# Patient Record
Sex: Female | Born: 1985
Health system: Southern US, Community
[De-identification: ages and names within clinical notes are randomized; demographics above are authoritative.]

## PROBLEM LIST (undated history)

## (undated) ENCOUNTER — Inpatient Hospital Stay (HOSPITAL_COMMUNITY): Payer: Self-pay

## (undated) DIAGNOSIS — K219 Gastro-esophageal reflux disease without esophagitis: Secondary | ICD-10-CM

## (undated) DIAGNOSIS — R351 Nocturia: Secondary | ICD-10-CM

## (undated) DIAGNOSIS — J45909 Unspecified asthma, uncomplicated: Secondary | ICD-10-CM

## (undated) DIAGNOSIS — R350 Frequency of micturition: Secondary | ICD-10-CM

## (undated) DIAGNOSIS — N3010 Interstitial cystitis (chronic) without hematuria: Secondary | ICD-10-CM

## (undated) DIAGNOSIS — R102 Pelvic and perineal pain: Secondary | ICD-10-CM

## (undated) DIAGNOSIS — R3915 Urgency of urination: Secondary | ICD-10-CM

## (undated) HISTORY — PX: WISDOM TOOTH EXTRACTION: SHX21

---
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
## 2006-07-13 ENCOUNTER — Emergency Department (HOSPITAL_COMMUNITY): Admission: EM | Admit: 2006-07-13 | Discharge: 2006-07-13 | Payer: Self-pay | Admitting: Emergency Medicine

## 2009-08-22 HISTORY — PX: OTHER SURGICAL HISTORY: SHX169

## 2012-07-06 ENCOUNTER — Other Ambulatory Visit: Payer: Self-pay | Admitting: Urology

## 2012-07-27 ENCOUNTER — Encounter (HOSPITAL_BASED_OUTPATIENT_CLINIC_OR_DEPARTMENT_OTHER): Payer: Self-pay | Admitting: *Deleted

## 2012-07-27 NOTE — Progress Notes (Signed)
NPO AFTER MN. ARRIVES AT 0745. NEEDS HG AND URINE PREG. WILL DO ADVAIR INHALER AM OF SURG W/ SIP OF WATER.

## 2012-08-01 NOTE — H&P (Signed)
History of Present Illness   Sherry Fuentes has suprapubic pressure and frequency. She has had microscopic hematuria. She has uncommon urge incontinence not wearing a pad. She has frequency. We talked about a hydrodistension. Dawn was going to call her about co-pays.   Review of Systems: No change in bowel or neurologic systems.   Urinalysis: 3-6 red blood cells.  She was here to review her CT scan.  I reviewed Sherry Fuentes's CT scan and I thought that it was within normal limits. I thought she had some peripelvic cysts. I will call if the report differs.    Past Medical History Problems  1. History of  Asthma 493.90  Surgical History Problems  1. History of  Oral Surgery Tooth Extraction 2. History of  Wrist Surgery  Current Meds 1. Advair Diskus 100-50 MCG/DOSE Inhalation Aerosol Powder Breath Activated; Therapy:  (Recorded:01Nov2013) to 2. Allegra Allergy 180 MG Oral Tablet; Therapy: (Recorded:01Nov2013) to 3. Ibuprofen 800 MG Oral Tablet; Therapy: (Recorded:01Nov2013) to 4. Multi Vitamin/Minerals Oral Tablet; Therapy: (Recorded:01Nov2013) to  Allergies Medication  1. No Known Drug Allergies  Family History Problems  1. Maternal aunt's history of  Breast Cancer V16.3 2. Maternal history of  Diabetes Mellitus V18.0 3. Maternal grandmother's history of  Diabetes Mellitus V18.0 4. Maternal grandmother's history of  Heart Disease V17.49 5. Fraternal history of  Nephrolithiasis 6. Maternal history of  Skin Cancer V16.8 7. Maternal grandfather's history of  Skin Cancer V16.8  Social History Problems  1. Alcohol Use 2. Caffeine Use 3. Marital History - Currently Married 4. Never A Smoker 5. Occupation: Homemaker  Results/Data  Urine [Data Includes: Last 1 Day]   14Nov2013  COLOR YELLOW   APPEARANCE CLEAR   SPECIFIC GRAVITY 1.015   pH 6.0   GLUCOSE NEG mg/dL  BILIRUBIN NEG   KETONE NEG mg/dL  BLOOD MOD   PROTEIN NEG mg/dL  UROBILINOGEN 0.2 mg/dL  NITRITE NEG    LEUKOCYTE ESTERASE NEG   SQUAMOUS EPITHELIAL/HPF FEW   WBC NONE SEEN WBC/hpf  RBC 3-6 RBC/hpf  BACTERIA RARE   CRYSTALS NONE SEEN   CASTS NONE SEEN    Assessment Assessed  1. Microscopic Hematuria 599.72 2. Abdominal Pain 789.00  Plan   Discussion/Summary   Sherry Fuentes will be called if her CT scan ____. I talked to her a little bit about what would happen if she had interstitial cystitis and she understands it is not an easy condition to treat. She would like to proceed with the hydrodistension and I will have Dawn call her. Dawn may have spoken to her last time but specifics on co-pays I do not think were given.  After a thorough review of the management options for the patient's condition the patient  elected to proceed with surgical therapy as noted above. We have discussed the potential benefits and risks of the procedure, side effects of the proposed treatment, the likelihood of the patient achieving the goals of the procedure, and any potential problems that might occur during the procedure or recuperation. Informed consent has been obtained.

## 2012-08-02 ENCOUNTER — Encounter (HOSPITAL_BASED_OUTPATIENT_CLINIC_OR_DEPARTMENT_OTHER): Payer: Self-pay | Admitting: Anesthesiology

## 2012-08-02 ENCOUNTER — Ambulatory Visit (HOSPITAL_BASED_OUTPATIENT_CLINIC_OR_DEPARTMENT_OTHER)
Admission: RE | Admit: 2012-08-02 | Discharge: 2012-08-02 | Disposition: A | Discharge status: Home / Self Care | Payer: BC Managed Care – PPO | Source: Ambulatory Visit | Attending: Urology | Admitting: Urology

## 2012-08-02 ENCOUNTER — Encounter (HOSPITAL_BASED_OUTPATIENT_CLINIC_OR_DEPARTMENT_OTHER): Payer: Self-pay | Admitting: *Deleted

## 2012-08-02 ENCOUNTER — Ambulatory Visit (HOSPITAL_BASED_OUTPATIENT_CLINIC_OR_DEPARTMENT_OTHER): Payer: BC Managed Care – PPO | Admitting: Anesthesiology

## 2012-08-02 ENCOUNTER — Encounter (HOSPITAL_BASED_OUTPATIENT_CLINIC_OR_DEPARTMENT_OTHER)
Admission: RE | Disposition: A | Discharge status: Home / Self Care | Payer: Self-pay | Source: Ambulatory Visit | Attending: Urology

## 2012-08-02 DIAGNOSIS — N3010 Interstitial cystitis (chronic) without hematuria: Secondary | ICD-10-CM | POA: Insufficient documentation

## 2012-08-02 DIAGNOSIS — Z79899 Other long term (current) drug therapy: Secondary | ICD-10-CM | POA: Insufficient documentation

## 2012-08-02 DIAGNOSIS — R3129 Other microscopic hematuria: Secondary | ICD-10-CM | POA: Insufficient documentation

## 2012-08-02 DIAGNOSIS — J45909 Unspecified asthma, uncomplicated: Secondary | ICD-10-CM | POA: Insufficient documentation

## 2012-08-02 HISTORY — DX: Nocturia: R35.1

## 2012-08-02 HISTORY — DX: Urgency of urination: R39.15

## 2012-08-02 HISTORY — PX: CYSTO WITH HYDRODISTENSION: SHX5453

## 2012-08-02 HISTORY — DX: Pelvic and perineal pain: R10.2

## 2012-08-02 HISTORY — DX: Frequency of micturition: R35.0

## 2012-08-02 HISTORY — DX: Unspecified asthma, uncomplicated: J45.909

## 2012-08-02 LAB — POCT HEMOGLOBIN-HEMACUE: Hemoglobin: 12.4 g/dL (ref 12.0–15.0)

## 2012-08-02 LAB — POCT PREGNANCY, URINE: Preg Test, Ur: NEGATIVE

## 2012-08-02 SURGERY — CYSTOSCOPY, WITH BLADDER HYDRODISTENSION
Anesthesia: General | Site: Bladder | Wound class: Clean Contaminated

## 2012-08-02 MED ORDER — FENTANYL CITRATE 0.05 MG/ML IJ SOLN
INTRAMUSCULAR | Status: DC | PRN
  Administered 2012-08-02 (×4): 50 ug via INTRAVENOUS

## 2012-08-02 MED ORDER — HYDROCODONE-ACETAMINOPHEN 5-500 MG PO TABS: 1.0000 | ORAL_TABLET | Freq: Four times a day (QID) | ORAL | Status: DC | PRN

## 2012-08-02 MED ORDER — CIPROFLOXACIN IN D5W 400 MG/200ML IV SOLN
400.0000 mg | INTRAVENOUS | Status: AC
  Administered 2012-08-02: 400 mg via INTRAVENOUS
  Filled 2012-08-02: qty 200

## 2012-08-02 MED ORDER — STERILE WATER FOR IRRIGATION IR SOLN
Status: DC | PRN
  Administered 2012-08-02: 3000 mL

## 2012-08-02 MED ORDER — PHENAZOPYRIDINE HCL 200 MG PO TABS
ORAL | Status: DC | PRN
  Administered 2012-08-02: 09:00:00 via INTRAVESICAL

## 2012-08-02 MED ORDER — PROPOFOL 10 MG/ML IV BOLUS
INTRAVENOUS | Status: DC | PRN
  Administered 2012-08-02: 240 mg via INTRAVENOUS

## 2012-08-02 MED ORDER — LACTATED RINGERS IV SOLN
INTRAVENOUS | Status: DC
  Filled 2012-08-02: qty 1000

## 2012-08-02 MED ORDER — DEXAMETHASONE SODIUM PHOSPHATE 4 MG/ML IJ SOLN
INTRAMUSCULAR | Status: DC | PRN
  Administered 2012-08-02: 10 mg via INTRAVENOUS

## 2012-08-02 MED ORDER — HYDROCODONE-ACETAMINOPHEN 5-325 MG PO TABS
1.0000 | ORAL_TABLET | Freq: Four times a day (QID) | ORAL | Status: DC | PRN
  Administered 2012-08-02: 1 via ORAL
  Filled 2012-08-02: qty 2

## 2012-08-02 MED ORDER — PROMETHAZINE HCL 25 MG/ML IJ SOLN
6.2500 mg | INTRAMUSCULAR | Status: DC | PRN
  Filled 2012-08-02: qty 1

## 2012-08-02 MED ORDER — CIPROFLOXACIN HCL 250 MG PO TABS: 250.0000 mg | ORAL_TABLET | Freq: Two times a day (BID) | ORAL | Status: DC

## 2012-08-02 MED ORDER — MEPERIDINE HCL 25 MG/ML IJ SOLN
6.2500 mg | INTRAMUSCULAR | Status: DC | PRN
  Filled 2012-08-02: qty 1

## 2012-08-02 MED ORDER — MIDAZOLAM HCL 5 MG/5ML IJ SOLN
INTRAMUSCULAR | Status: DC | PRN
  Administered 2012-08-02 (×2): 1 mg via INTRAVENOUS

## 2012-08-02 MED ORDER — KETOROLAC TROMETHAMINE 30 MG/ML IJ SOLN
INTRAMUSCULAR | Status: DC | PRN
  Administered 2012-08-02: 30 mg via INTRAVENOUS

## 2012-08-02 MED ORDER — LIDOCAINE HCL (CARDIAC) 20 MG/ML IV SOLN
INTRAVENOUS | Status: DC | PRN
  Administered 2012-08-02: 60 mg via INTRAVENOUS

## 2012-08-02 MED ORDER — FENTANYL CITRATE 0.05 MG/ML IJ SOLN
25.0000 ug | INTRAMUSCULAR | Status: DC | PRN
  Filled 2012-08-02: qty 1

## 2012-08-02 MED ORDER — LACTATED RINGERS IV SOLN
INTRAVENOUS | Status: DC
  Administered 2012-08-02: 100 mL/h via INTRAVENOUS
  Administered 2012-08-02 (×2): via INTRAVENOUS
  Filled 2012-08-02: qty 1000

## 2012-08-02 SURGICAL SUPPLY — 21 items
BAG DRAIN URO-CYSTO SKYTR STRL (DRAIN) ×2 IMPLANT
BAG DRN UROCATH (DRAIN) ×1
CANISTER SUCT LVC 12 LTR MEDI- (MISCELLANEOUS) IMPLANT
CATH FOLEY 2WAY SLVR  5CC 18FR (CATHETERS)
CATH FOLEY 2WAY SLVR 5CC 18FR (CATHETERS) IMPLANT
CATH ROBINSON RED A/P 12FR (CATHETERS) IMPLANT
CATH ROBINSON RED A/P 14FR (CATHETERS) ×1 IMPLANT
CLOTH BEACON ORANGE TIMEOUT ST (SAFETY) ×2 IMPLANT
DRAPE CAMERA CLOSED 9X96 (DRAPES) ×2 IMPLANT
ELECT REM PT RETURN 9FT ADLT (ELECTROSURGICAL)
ELECTRODE REM PT RTRN 9FT ADLT (ELECTROSURGICAL) IMPLANT
GLOVE BIO SURGEON STRL SZ7.5 (GLOVE) ×2 IMPLANT
GLOVE BIOGEL PI IND STRL 7.0 (GLOVE) IMPLANT
GLOVE BIOGEL PI INDICATOR 7.0 (GLOVE) ×1
GOWN STRL REIN XL XLG (GOWN DISPOSABLE) ×3 IMPLANT
NDL SAFETY ECLIPSE 18X1.5 (NEEDLE) ×1 IMPLANT
NEEDLE HYPO 18GX1.5 SHARP (NEEDLE) ×2
PACK CYSTOSCOPY (CUSTOM PROCEDURE TRAY) ×2 IMPLANT
SUT SILK 0 TIES 10X30 (SUTURE) IMPLANT
SYR 20CC LL (SYRINGE) ×2 IMPLANT
WATER STERILE IRR 3000ML UROMA (IV SOLUTION) ×2 IMPLANT

## 2012-08-02 NOTE — Op Note (Signed)
Preoperative diagnosis: Pelvic pain Postoperative diagnosis: Interstitial cystitis Surgery bladder hydrodistention plus cystoscopy and bladder installation therapy Surgeon: Dr. Lorin Picket Telia Amundson  The patient consented the above procedure with the above diagnoses. Preoperative antibiotics were given. 21 French cystoscope was utilized. Leg position was excellent  Bladder mucosa and trigone were normal. Is no stitch or foreign body or carcinoma. Bladder was hydrodistended to approxi-750 mL  On reinspection she diffuse glomerulations in the floor the bladder. There is no bladder injury. The glomerulations were fine  The bladder was emptied. As a separate procedure with a red rubber catheter I instilled 15 cc of 0.5% Marcaine and 400 mg of pretty and  The patient be treated for interstitial cystitis

## 2012-08-02 NOTE — Interval H&P Note (Signed)
**Note Sherry-Identified via Obfuscation** History and Physical Interval Note:  08/02/2012 7:07 AM  Sherry Fuentes  has presented today for surgery, with the diagnosis of pelvic pain  The various methods of treatment have been discussed with the patient and family. After consideration of risks, benefits and other options for treatment, the patient has consented to  Procedure(s) (LRB) with comments: CYSTOSCOPY/HYDRODISTENSION (N/A) - Cystoscopy/Hydrodistension Instillation of Marcaine and Pyridum as a surgical intervention .  The patient's history has been reviewed, patient examined, no change in status, stable for surgery.  I have reviewed the patient's chart and labs.  Questions were answered to the patient's satisfaction.     Sherry Fuentes A

## 2012-08-02 NOTE — Anesthesia Postprocedure Evaluation (Signed)
  Anesthesia Post-op Note  Patient: [name]  Procedure(s) Performed: Procedure(s) (LRB): CYSTOSCOPY/HYDRODISTENSION (N/A)  Patient Location: PACU  Anesthesia Type: General  Level of Consciousness: awake and alert   Airway and Oxygen Therapy: Patient Spontanous Breathing  Post-op Pain: mild  Post-op Assessment: Post-op Vital signs reviewed, Patient's Cardiovascular Status Stable, Respiratory Function Stable, Patent Airway and No signs of Nausea or vomiting  Last Vitals:  Filed Vitals:   08/02/12 1015  BP: 108/69  Pulse: 96  Temp:   Resp: 16    Post-op Vital Signs: stable   Complications: No apparent anesthesia complications

## 2012-08-02 NOTE — Anesthesia Procedure Notes (Signed)
Procedure Name: LMA Insertion Date/Time: 08/02/2012 9:17 AM Performed by: Fran Lowes Pre-anesthesia Checklist: Patient identified, Emergency Drugs available, Suction available and Patient being monitored Patient Re-evaluated:Patient Re-evaluated prior to inductionOxygen Delivery Method: Circle System Utilized Preoxygenation: Pre-oxygenation with 100% oxygen Intubation Type: IV induction Ventilation: Mask ventilation without difficulty LMA: LMA inserted LMA Size: 4.0 Number of attempts: 1 Airway Equipment and Method: bite block Placement Confirmation: positive ETCO2 Tube secured with: Tape Dental Injury: Teeth and Oropharynx as per pre-operative assessment

## 2012-08-02 NOTE — Transfer of Care (Signed)
Immediate Anesthesia Transfer of Care Note  Patient: Sherry Fuentes  Procedure(s) Performed: Procedure(s) (LRB): CYSTOSCOPY/HYDRODISTENSION (N/A)  Patient Location: Patient transported to PACU with oxygen via face mask at 4 Liters / Min  Anesthesia Type: General  Level of Consciousness: awake and alert   Airway & Oxygen Therapy: Patient Spontanous Breathing and Patient connected to face mask oxygen  Post-op Assessment: Report given to PACU RN and Post -op Vital signs reviewed and stable  Post vital signs: Reviewed and stable  Dentition: Teeth and oropharynx remain in pre-op condition  Complications: No apparent anesthesia complications

## 2012-08-02 NOTE — Anesthesia Preprocedure Evaluation (Signed)
Anesthesia Evaluation  Patient identified by MRN, date of birth, ID band Patient awake    Reviewed: Allergy & Precautions, H&P , NPO status , Patient's Chart, lab work & pertinent test results  Airway Mallampati: II TM Distance: >3 FB Neck ROM: Full    Dental No notable dental hx.    Pulmonary neg pulmonary ROS, asthma ,  breath sounds clear to auscultation  Pulmonary exam normal       Cardiovascular negative cardio ROS  Rhythm:Regular Rate:Normal     Neuro/Psych negative neurological ROS  negative psych ROS   GI/Hepatic negative GI ROS, Neg liver ROS,   Endo/Other  negative endocrine ROS  Renal/GU negative Renal ROS  negative genitourinary   Musculoskeletal negative musculoskeletal ROS (+)   Abdominal   Peds negative pediatric ROS (+)  Hematology negative hematology ROS (+)   Anesthesia Other Findings   Reproductive/Obstetrics negative OB ROS                           Anesthesia Physical Anesthesia Plan  ASA: II  Anesthesia Plan: General   Post-op Pain Management:    Induction: Intravenous  Airway Management Planned: LMA  Additional Equipment:   Intra-op Plan:   Post-operative Plan: Extubation in OR  Informed Consent: I have reviewed the patients History and Physical, chart, labs and discussed the procedure including the risks, benefits and alternatives for the proposed anesthesia with the patient or authorized representative who has indicated his/her understanding and acceptance.   Dental advisory given  Plan Discussed with: CRNA  Anesthesia Plan Comments:         Anesthesia Quick Evaluation

## 2012-08-03 ENCOUNTER — Encounter (HOSPITAL_BASED_OUTPATIENT_CLINIC_OR_DEPARTMENT_OTHER): Payer: Self-pay | Admitting: Urology

## 2013-08-22 HISTORY — PX: OTHER SURGICAL HISTORY: SHX169

## 2013-08-22 HISTORY — PX: TONSILLECTOMY: SUR1361

## 2015-07-14 ENCOUNTER — Other Ambulatory Visit: Payer: Self-pay | Admitting: Family Medicine

## 2015-07-14 ENCOUNTER — Ambulatory Visit
Admission: RE | Admit: 2015-07-14 | Discharge: 2015-07-14 | Disposition: A | Discharge status: Home / Self Care | Payer: 59 | Source: Ambulatory Visit | Attending: Family Medicine | Admitting: Family Medicine

## 2015-07-14 DIAGNOSIS — M79604 Pain in right leg: Secondary | ICD-10-CM

## 2015-10-01 ENCOUNTER — Other Ambulatory Visit: Payer: Self-pay | Admitting: Family Medicine

## 2015-10-01 DIAGNOSIS — E010 Iodine-deficiency related diffuse (endemic) goiter: Secondary | ICD-10-CM

## 2015-10-02 ENCOUNTER — Ambulatory Visit
Admission: RE | Admit: 2015-10-02 | Discharge: 2015-10-02 | Disposition: A | Discharge status: Home / Self Care | Payer: 59 | Source: Ambulatory Visit | Attending: Family Medicine | Admitting: Family Medicine

## 2015-10-02 DIAGNOSIS — E010 Iodine-deficiency related diffuse (endemic) goiter: Secondary | ICD-10-CM

## 2015-10-27 ENCOUNTER — Encounter (HOSPITAL_COMMUNITY): Payer: Self-pay | Admitting: *Deleted

## 2015-10-27 DIAGNOSIS — Z3202 Encounter for pregnancy test, result negative: Secondary | ICD-10-CM | POA: Diagnosis not present

## 2015-10-27 DIAGNOSIS — Z7951 Long term (current) use of inhaled steroids: Secondary | ICD-10-CM | POA: Insufficient documentation

## 2015-10-27 DIAGNOSIS — M545 Low back pain: Secondary | ICD-10-CM | POA: Diagnosis not present

## 2015-10-27 DIAGNOSIS — Z792 Long term (current) use of antibiotics: Secondary | ICD-10-CM | POA: Diagnosis not present

## 2015-10-27 DIAGNOSIS — Z79899 Other long term (current) drug therapy: Secondary | ICD-10-CM | POA: Diagnosis not present

## 2015-10-27 DIAGNOSIS — R110 Nausea: Secondary | ICD-10-CM | POA: Diagnosis not present

## 2015-10-27 DIAGNOSIS — R109 Unspecified abdominal pain: Secondary | ICD-10-CM | POA: Insufficient documentation

## 2015-10-27 DIAGNOSIS — N939 Abnormal uterine and vaginal bleeding, unspecified: Secondary | ICD-10-CM | POA: Insufficient documentation

## 2015-10-27 DIAGNOSIS — R319 Hematuria, unspecified: Secondary | ICD-10-CM | POA: Insufficient documentation

## 2015-10-27 LAB — CBC
HEMATOCRIT: 41.9 % (ref 36.0–46.0)
HEMOGLOBIN: 14.1 g/dL (ref 12.0–15.0)
MCH: 30.1 pg (ref 26.0–34.0)
MCHC: 33.7 g/dL (ref 30.0–36.0)
MCV: 89.3 fL (ref 78.0–100.0)
Platelets: 324 10*3/uL (ref 150–400)
RBC: 4.69 MIL/uL (ref 3.87–5.11)
RDW: 13 % (ref 11.5–15.5)
WBC: 9.3 10*3/uL (ref 4.0–10.5)

## 2015-10-27 LAB — BASIC METABOLIC PANEL
ANION GAP: 12 (ref 5–15)
BUN: <5 mg/dL — ABNORMAL LOW (ref 6–20)
CHLORIDE: 108 mmol/L (ref 101–111)
CO2: 21 mmol/L — AB (ref 22–32)
Calcium: 9.3 mg/dL (ref 8.9–10.3)
Creatinine, Ser: 0.77 mg/dL (ref 0.44–1.00)
GFR calc non Af Amer: >60 mL/min (ref >60)
GLUCOSE: 106 mg/dL — AB (ref 65–99)
POTASSIUM: 4 mmol/L (ref 3.5–5.1)
Sodium: 141 mmol/L (ref 135–145)

## 2015-10-27 LAB — I-STAT BETA HCG BLOOD, ED (MC, WL, AP ONLY)

## 2015-10-27 NOTE — ED Notes (Signed)
Pt states that she has bilateral flank pain that woke her from sleep last night; pt noted blood and mucous to her underwear today; unsure if vaginal or urethral; pt c/o nausea and chills; pt c/o nausea / no vomting; pt states that the pain has progressively gotten worse

## 2015-10-28 ENCOUNTER — Emergency Department (HOSPITAL_COMMUNITY): Payer: 59

## 2015-10-28 ENCOUNTER — Emergency Department (HOSPITAL_COMMUNITY)
Admission: EM | Admit: 2015-10-28 | Discharge: 2015-10-28 | Disposition: A | Discharge status: Home / Self Care | Payer: 59 | Attending: Emergency Medicine | Admitting: Emergency Medicine

## 2015-10-28 DIAGNOSIS — R319 Hematuria, unspecified: Secondary | ICD-10-CM

## 2015-10-28 DIAGNOSIS — R109 Unspecified abdominal pain: Secondary | ICD-10-CM

## 2015-10-28 HISTORY — DX: Interstitial cystitis (chronic) without hematuria: N30.10

## 2015-10-28 LAB — URINE MICROSCOPIC-ADD ON

## 2015-10-28 LAB — URINALYSIS, ROUTINE W REFLEX MICROSCOPIC
GLUCOSE, UA: NEGATIVE mg/dL
NITRITE: NEGATIVE
PH: 6 (ref 5.0–8.0)
PROTEIN: NEGATIVE mg/dL
Specific Gravity, Urine: 1.022 (ref 1.005–1.030)

## 2015-10-28 MED ORDER — ONDANSETRON HCL 4 MG/2ML IJ SOLN
4.0000 mg | Freq: Once | INTRAMUSCULAR | Status: AC
  Administered 2015-10-28: 4 mg via INTRAVENOUS

## 2015-10-28 MED ORDER — NITROFURANTOIN MONOHYD MACRO 100 MG PO CAPS: 100.0000 mg | ORAL_CAPSULE | Freq: Two times a day (BID) | ORAL | Status: DC

## 2015-10-28 MED ORDER — ONDANSETRON 4 MG PO TBDP
4.0000 mg | ORAL_TABLET | Freq: Once | ORAL | Status: AC
  Administered 2015-10-28: 4 mg via ORAL
  Filled 2015-10-28: qty 1

## 2015-10-28 MED ORDER — HYDROMORPHONE HCL 1 MG/ML IJ SOLN
1.0000 mg | Freq: Once | INTRAMUSCULAR | Status: AC
  Administered 2015-10-28: 1 mg via INTRAVENOUS
  Filled 2015-10-28: qty 1

## 2015-10-28 MED ORDER — HYDROMORPHONE HCL 1 MG/ML IJ SOLN
1.0000 mg | Freq: Once | INTRAMUSCULAR | Status: AC
  Administered 2015-10-28 (×2): 1 mg via INTRAVENOUS

## 2015-10-28 MED ORDER — ONDANSETRON HCL 4 MG/2ML IJ SOLN
INTRAMUSCULAR | Status: DC
Start: 2015-10-28 — End: 2015-10-28
  Filled 2015-10-28: qty 2

## 2015-10-28 MED ORDER — HYDROCODONE-ACETAMINOPHEN 5-325 MG PO TABS
2.0000 | ORAL_TABLET | ORAL | Status: DC | PRN
Start: 2015-10-28 — End: 2015-11-01

## 2015-10-28 MED ORDER — SODIUM CHLORIDE 0.9 % IV BOLUS (SEPSIS)
1000.0000 mL | Freq: Once | INTRAVENOUS | Status: AC
  Administered 2015-10-28: 1000 mL via INTRAVENOUS

## 2015-10-28 MED ORDER — HYDROMORPHONE HCL 1 MG/ML IJ SOLN
INTRAMUSCULAR | Status: AC
  Administered 2015-10-28: 1 mg via INTRAVENOUS
  Filled 2015-10-28: qty 1

## 2015-10-28 MED ORDER — ONDANSETRON HCL 4 MG PO TABS: 4.0000 mg | ORAL_TABLET | Freq: Four times a day (QID) | ORAL | Status: DC

## 2015-10-28 NOTE — ED Notes (Signed)
Patient transported to CT 

## 2015-10-28 NOTE — ED Notes (Signed)
Patient reports that pain has returned.  Pain 7/10

## 2015-10-28 NOTE — Discharge Instructions (Signed)
Hematuria, Adult °Hematuria is blood in your urine. It can be caused by a bladder infection, kidney infection, prostate infection, kidney stone, or cancer of your urinary tract. Infections can usually be treated with medicine, and a kidney stone usually will pass through your urine. If neither of these is the cause of your hematuria, further workup to find out the reason may be needed. °It is very important that you tell your health care provider about any blood you see in your urine, even if the blood stops without treatment or happens without causing pain. Blood in your urine that happens and then stops and then happens again can be a symptom of a very serious condition. Also, pain is not a symptom in the initial stages of many urinary cancers. °HOME CARE INSTRUCTIONS  °· Drink lots of fluid, 3-4 quarts a day. If you have been diagnosed with an infection, cranberry juice is especially recommended, in addition to large amounts of water. °· Avoid caffeine, tea, and carbonated beverages because they tend to irritate the bladder. °· Avoid alcohol because it may irritate the prostate. °· Take all medicines as directed by your health care provider. °· If you were prescribed an antibiotic medicine, finish it all even if you start to feel better. °· If you have been diagnosed with a kidney stone, follow your health care provider's instructions regarding straining your urine to catch the stone. °· Empty your bladder often. Avoid holding urine for long periods of time. °· After a bowel movement, women should cleanse front to back. Use each tissue only once. °· Empty your bladder before and after sexual intercourse if you are a female. °SEEK MEDICAL CARE IF: °· You develop back pain. °· You have a fever. °· You have a feeling of sickness in your stomach (nausea) or vomiting. °· Your symptoms are not better in 3 days. Return sooner if you are getting worse. °SEEK IMMEDIATE MEDICAL CARE IF:  °· You develop severe vomiting and  are unable to keep the medicine down. °· You develop severe back or abdominal pain despite taking your medicines. °· You begin passing a large amount of blood or clots in your urine. °· You feel extremely weak or faint, or you pass out. °MAKE SURE YOU:  °· Understand these instructions. °· Will watch your condition. °· Will get help right away if you are not doing well or get worse. °  °This information is not intended to replace advice given to you by your health care provider. Make sure you discuss any questions you have with your health care provider. °  °Document Released: 08/08/2005 Document Revised: 08/29/2014 Document Reviewed: 04/08/2013 °Elsevier Interactive Patient Education ©2016 [employer] ° °Flank Pain °Flank pain refers to pain that is located on the side of the body between the upper abdomen and the back. The pain may occur over a short period of time (acute) or may be long-term or reoccurring (chronic). It may be mild or severe. Flank pain can be caused by many things. °CAUSES  °Some of the more common causes of flank pain include: °· Muscle strains.   °· Muscle spasms.   °· A disease of your spine (vertebral disk disease).   °· A lung infection (pneumonia).   °· Fluid around your lungs (pulmonary edema).   °· A kidney infection.   °· Kidney stones.   °· A very painful skin rash caused by the chickenpox virus (shingles).   °· Gallbladder disease.   °HOME CARE INSTRUCTIONS  °Home care will depend on the cause of your   pain. In general, °· Rest as directed by your caregiver. °· Drink enough fluids to keep your urine clear or pale yellow. °· Only take over-the-counter or prescription medicines as directed by your caregiver. Some medicines may help relieve the pain. °· Tell your caregiver about any changes in your pain. °· Follow up with your caregiver as directed. °SEEK IMMEDIATE MEDICAL CARE IF:  °· Your pain is not controlled with medicine.   °· You have new or worsening symptoms. °· Your pain  increases.   °· You have abdominal pain.   °· You have shortness of breath.   °· You have persistent nausea or vomiting.   °· You have swelling in your abdomen.   °· You feel faint or pass out.   °· You have blood in your urine. °· You have a fever or persistent symptoms for more than 2-3 days. °· You have a fever and your symptoms suddenly get worse. °MAKE SURE YOU:  °· Understand these instructions. °· Will watch your condition. °· Will get help right away if you are not doing well or get worse. °  °This information is not intended to replace advice given to you by your health care provider. Make sure you discuss any questions you have with your health care provider. °  °Document Released: 09/29/2005 Document Revised: 05/02/2012 Document Reviewed: 03/22/2012 °Elsevier Interactive Patient Education ©2016 [employer] ° °

## 2015-10-28 NOTE — ED Provider Notes (Signed)
**Note Sherry-Identified via Obfuscation** CSN: [id number]     Arrival date & time 10/27/15  2013 History   By signing my name below, I, Arlan OrganAshley Leger, attest that this documentation has been prepared under the direction and in the presence of Gilda Creasehristopher J Pollina, MD.  Electronically Signed: Arlan OrganAshley Leger, ED Scribe. 10/28/2015. 3:19 AM.   Chief Complaint  Patient presents with  . Flank Pain   The history is provided by the patient. No language interpreter was used.    HPI Comments: Sherry Fuentes is a 30 y.o. female without any pertinent past medical history who presents to the Emergency Department complaining of constant, ongoing bilateral flank pain onset last night that woke her from sleep. Pain is described as burning. No aggravating or alleviating factors at this time. She also reports nausea and chills. Pt noted small amount of blood and yellow mucous in her underwear this morning after wiping herself. No recent fever, vomiting, diarrhea, or abdominal pain. No prior history of same.  PCP: Lolita PatellaEADE,ROBERT ALEXANDER, MD   Past Medical History  Diagnosis Date  . Pelvic pain in female   . Asthma   . Frequency of urination   . Urgency of urination   . Nocturia   . Reflux OCCASIONAL-- WATCHES DIET  . Interstitial cystitis    Past Surgical History  Procedure Laterality Date  . Wisdom tooth extraction  AGE 79  . Removal right wrist cyst  2011  . Cysto with hydrodistension  08/02/2012    Procedure: CYSTOSCOPY/HYDRODISTENSION;  Surgeon: Martina SinnerScott A MacDiarmid, MD;  [facility];  Service: Urology;  Laterality: N/A;  Cystoscopy/Hydrodistension Instillation of Marcaine and Pyridum  . Bladder extension    . Tonsillectomy     No family history on file. Social History  Substance Use Topics  . Smoking status: Never Smoker   . Smokeless tobacco: Never Used  . Alcohol Use: Yes     Comment: OCCASIONAL   OB History    No data available     Review of Systems  Constitutional: Negative for fever and chills.   Respiratory: Negative for shortness of breath.   Cardiovascular: Negative for chest pain.  Gastrointestinal: Positive for nausea. Negative for vomiting and abdominal pain.  Genitourinary: Positive for flank pain and vaginal bleeding.  Neurological: Negative for headaches.  Psychiatric/Behavioral: Negative for confusion.  All other systems reviewed and are negative.     Allergies  Review of patient's allergies indicates no known allergies.  Home Medications   Prior to Admission medications   Medication Sig Start Date End Date Taking? Authorizing Provider  ciprofloxacin (CIPRO) 250 MG tablet Take 1 tablet (250 mg total) by mouth 2 (two) times daily. 08/02/12   Alfredo MartinezScott MacDiarmid, MD  fexofenadine (ALLEGRA) 180 MG tablet Take 180 mg by mouth daily.    Historical Provider, MD  Fluticasone-Salmeterol (ADVAIR DISKUS IN) Inhale 1 puff into the lungs 2 (two) times daily.    Historical Provider, MD  HYDROcodone-acetaminophen (VICODIN) 5-500 MG per tablet Take 1-2 tablets by mouth every 6 (six) hours as needed for pain. 08/02/12   Alfredo MartinezScott MacDiarmid, MD  ibuprofen (ADVIL,MOTRIN) 800 MG tablet Take 800 mg by mouth every 8 (eight) hours as needed.    Historical Provider, MD  Multiple Vitamin (MULTIVITAMIN) tablet Take 1 tablet by mouth daily.    Historical Provider, MD  PRESCRIPTION MEDICATION Take 1 tablet by mouth daily.    Historical Provider, MD   Triage Vitals: BP 167/95 mmHg  Pulse 113  Temp(Src) 99.3 F (37.4 C) (  Oral)  Resp 18  Ht  (1.702 m)  Wt 195 lb (88.451 kg)  BMI 30.53 kg/m2  SpO2 99%  LMP 09/29/2015   Physical Exam  Constitutional: She is oriented to person, place, and time. She appears well-developed and well-nourished. No distress.  HENT:  Head: Normocephalic and atraumatic.  Right Ear: Hearing normal.  Left Ear: Hearing normal.  Nose: Nose normal.  Mouth/Throat: Oropharynx is clear and moist and mucous membranes are normal.  Eyes: Conjunctivae and EOM are normal.  Pupils are equal, round, and reactive to light.  Neck: Normal range of motion. Neck supple.  Cardiovascular: Regular rhythm, S1 normal and S2 normal.  Exam reveals no gallop and no friction rub.   No murmur heard. Pulmonary/Chest: Effort normal and breath sounds normal. No respiratory distress. She exhibits no tenderness.  Abdominal: Soft. Normal appearance and bowel sounds are normal. There is no hepatosplenomegaly. There is no tenderness. There is no rebound, no guarding, no tenderness at McBurney's point and negative Murphy's sign. No hernia.  Musculoskeletal: Normal range of motion. She exhibits tenderness.  Tender diffusely to bilateral lower back   Neurological: She is alert and oriented to person, place, and time. She has normal strength. No cranial nerve deficit or sensory deficit. Coordination normal. GCS eye subscore is 4. GCS verbal subscore is 5. GCS motor subscore is 6.  Skin: Skin is warm, dry and intact. No rash noted. No cyanosis.  Psychiatric: She has a normal mood and affect. Her speech is normal and behavior is normal. Thought content normal.  Nursing note and vitals reviewed.   ED Course  Procedures (including critical care time)  DIAGNOSTIC STUDIES: Oxygen Saturation is 99% on RA, Normal by my interpretation.    COORDINATION OF CARE: 3:14 AM- Will order urinalysis, i-stat beta hCG blood, BMP, and CBC. Discussed treatment plan with pt at bedside and pt agreed to plan.     Labs Review Labs Reviewed  BASIC METABOLIC PANEL - Abnormal; Notable for the following:    CO2 21 (*)    Glucose, Bld 106 (*)    BUN <5 (*)    All other components within normal limits  URINE CULTURE  CBC  URINALYSIS, ROUTINE W REFLEX MICROSCOPIC (NOT AT [facility])  I-STAT BETA HCG BLOOD, ED ([facility], [facility], [facility] ONLY)    Imaging Review No results found. I have personally reviewed and evaluated these images and lab results as part of my medical decision-making.   EKG Interpretation None      MDM    Final diagnoses:  None  flank pain Hematuria  Patient presents to the ER for evaluation of back pain. Patient has been experiencing bilateral flank pain with nausea. She has had chills, thinks that she has had a fever. She reports that she has small amount of spotting in her underwear and then blood on the tissue when she wiped after urinating. She has not had any dysuria.  Workup has been unremarkable. She does have microscopic hematuria. Patient just had a thorough workup and pelvic exam including ultrasound by her OB/GYN this past week. She does not require repeat. She did undergo CT scan to evaluate for hematuria, rule out kidney stone. No abnormality was noted. Will treat empirically for possible infection causing hematuria, follow-up with Dr. Sherron Monday, her urologist.  I personally performed the services described in this documentation, which was scribed in my presence. The recorded information has been reviewed and is accurate.    Gilda Crease, MD 10/28/15 [phone]

## 2015-10-29 LAB — URINE CULTURE: SPECIAL REQUESTS: NORMAL

## 2015-10-31 ENCOUNTER — Emergency Department (HOSPITAL_COMMUNITY)
Admission: EM | Admit: 2015-10-31 | Discharge: 2015-11-01 | Disposition: A | Discharge status: Home / Self Care | Payer: 59 | Attending: Emergency Medicine | Admitting: Emergency Medicine

## 2015-10-31 ENCOUNTER — Emergency Department (HOSPITAL_COMMUNITY): Payer: 59

## 2015-10-31 ENCOUNTER — Encounter (HOSPITAL_COMMUNITY): Payer: Self-pay | Admitting: *Deleted

## 2015-10-31 DIAGNOSIS — R102 Pelvic and perineal pain: Secondary | ICD-10-CM

## 2015-10-31 DIAGNOSIS — R109 Unspecified abdominal pain: Secondary | ICD-10-CM | POA: Diagnosis present

## 2015-10-31 DIAGNOSIS — Z79818 Long term (current) use of other agents affecting estrogen receptors and estrogen levels: Secondary | ICD-10-CM | POA: Diagnosis not present

## 2015-10-31 DIAGNOSIS — Z8719 Personal history of other diseases of the digestive system: Secondary | ICD-10-CM | POA: Insufficient documentation

## 2015-10-31 DIAGNOSIS — R000 Tachycardia, unspecified: Secondary | ICD-10-CM | POA: Insufficient documentation

## 2015-10-31 DIAGNOSIS — N858 Other specified noninflammatory disorders of uterus: Secondary | ICD-10-CM | POA: Insufficient documentation

## 2015-10-31 DIAGNOSIS — Z79899 Other long term (current) drug therapy: Secondary | ICD-10-CM | POA: Diagnosis not present

## 2015-10-31 DIAGNOSIS — Z7951 Long term (current) use of inhaled steroids: Secondary | ICD-10-CM | POA: Insufficient documentation

## 2015-10-31 DIAGNOSIS — Z87448 Personal history of other diseases of urinary system: Secondary | ICD-10-CM | POA: Diagnosis not present

## 2015-10-31 DIAGNOSIS — J45909 Unspecified asthma, uncomplicated: Secondary | ICD-10-CM | POA: Diagnosis not present

## 2015-10-31 DIAGNOSIS — Z3202 Encounter for pregnancy test, result negative: Secondary | ICD-10-CM | POA: Diagnosis not present

## 2015-10-31 LAB — COMPREHENSIVE METABOLIC PANEL
ALT: 96 U/L — AB (ref 14–54)
AST: 45 U/L — AB (ref 15–41)
Albumin: 4.4 g/dL (ref 3.5–5.0)
Alkaline Phosphatase: 52 U/L (ref 38–126)
Anion gap: 13 (ref 5–15)
CHLORIDE: 97 mmol/L — AB (ref 101–111)
CO2: 19 mmol/L — AB (ref 22–32)
CREATININE: 0.61 mg/dL (ref 0.44–1.00)
Calcium: 8.8 mg/dL — ABNORMAL LOW (ref 8.9–10.3)
Glucose, Bld: 95 mg/dL (ref 65–99)
POTASSIUM: 3.4 mmol/L — AB (ref 3.5–5.1)
SODIUM: 129 mmol/L — AB (ref 135–145)
Total Bilirubin: 1.1 mg/dL (ref 0.3–1.2)
Total Protein: 7.7 g/dL (ref 6.5–8.1)

## 2015-10-31 LAB — CBC WITH DIFFERENTIAL/PLATELET
Basophils Absolute: 0 10*3/uL (ref 0.0–0.1)
Basophils Relative: 0 %
Eosinophils Absolute: 0.1 10*3/uL (ref 0.0–0.7)
Eosinophils Relative: 1 %
HEMATOCRIT: 40 % (ref 36.0–46.0)
HEMOGLOBIN: 14 g/dL (ref 12.0–15.0)
LYMPHS ABS: 3.5 10*3/uL (ref 0.7–4.0)
LYMPHS PCT: 33 %
MCH: 29.7 pg (ref 26.0–34.0)
MCHC: 35 g/dL (ref 30.0–36.0)
MCV: 84.7 fL (ref 78.0–100.0)
MONOS PCT: 10 %
Monocytes Absolute: 1.1 10*3/uL — ABNORMAL HIGH (ref 0.1–1.0)
NEUTROS ABS: 5.9 10*3/uL (ref 1.7–7.7)
NEUTROS PCT: 56 %
PLATELETS: 295 10*3/uL (ref 150–400)
RBC: 4.72 MIL/uL (ref 3.87–5.11)
RDW: 12.5 % (ref 11.5–15.5)
WBC: 10.6 10*3/uL — ABNORMAL HIGH (ref 4.0–10.5)

## 2015-10-31 LAB — URINALYSIS, ROUTINE W REFLEX MICROSCOPIC
Bilirubin Urine: NEGATIVE
Glucose, UA: NEGATIVE mg/dL
Ketones, ur: 40 mg/dL — AB
Leukocytes, UA: NEGATIVE
NITRITE: NEGATIVE
Protein, ur: NEGATIVE mg/dL
SPECIFIC GRAVITY, URINE: 1.002 — AB (ref 1.005–1.030)
pH: 6.5 (ref 5.0–8.0)

## 2015-10-31 LAB — URINE MICROSCOPIC-ADD ON
RBC / HPF: NONE SEEN RBC/hpf (ref 0–5)
SQUAMOUS EPITHELIAL / LPF: NONE SEEN

## 2015-10-31 LAB — WET PREP, GENITAL
CLUE CELLS WET PREP: NONE SEEN
SPERM: NONE SEEN
Trich, Wet Prep: NONE SEEN
Yeast Wet Prep HPF POC: NONE SEEN

## 2015-10-31 LAB — I-STAT CG4 LACTIC ACID, ED: LACTIC ACID, VENOUS: 1.75 mmol/L (ref 0.5–2.0)

## 2015-10-31 MED ORDER — HYDROMORPHONE HCL 1 MG/ML IJ SOLN
1.0000 mg | Freq: Once | INTRAMUSCULAR | Status: AC
  Administered 2015-10-31: 1 mg via INTRAVENOUS
  Filled 2015-10-31: qty 1

## 2015-10-31 MED ORDER — SODIUM CHLORIDE 0.9 % IV BOLUS (SEPSIS)
1000.0000 mL | Freq: Once | INTRAVENOUS | Status: AC
  Administered 2015-10-31: 1000 mL via INTRAVENOUS

## 2015-10-31 MED ORDER — ONDANSETRON HCL 4 MG/2ML IJ SOLN
4.0000 mg | Freq: Once | INTRAMUSCULAR | Status: AC
  Administered 2015-10-31: 4 mg via INTRAVENOUS
  Filled 2015-10-31: qty 2

## 2015-10-31 NOTE — ED Notes (Signed)
Pt was treated here Tuesday night for kidney infection, and it is getting worse she is passing blood and mucous,  She is also having tremendous pain in her urethra, and a lot of low back pain and chills,  Unable to have bowel movement also

## 2015-10-31 NOTE — ED Notes (Signed)
Pt called x 1 no answer

## 2015-10-31 NOTE — ED Provider Notes (Signed)
CSN: [id number]     Arrival date & time 10/31/15  2013 History   First MD Initiated Contact with Patient 10/31/15 2118     Chief Complaint  Patient presents with  . Flank Pain     (Consider location/radiation/quality/duration/timing/severity/associated sxs/prior Treatment) Patient is a 30 y.o. female presenting with flank pain. The history is provided by the patient.  Flank Pain This is a new problem. Episode onset: 6 days ago. The problem occurs constantly. The problem has been gradually worsening. Associated symptoms comments: Urinary pressure and occasional urethral spasms that started today. Hematuria since Monday that now is becoming more frequent with every urination. Pain in the back has now become constant and was so severe tonight despite taking Vicodin that she had to come back in because she was not able to wait till Monday to see her urologist. She denies ever having symptoms similar to this in the past. Her interstitial cystitis has never felt like this. She is also had nausea without vomiting. No noted fever. She did think she may be constipated but has been taking laxatives and passing stool has not improved her symptoms. She has had minor vaginal spotting but no discharge.. Exacerbated by: Urinating. The treatment provided no relief.    Past Medical History  Diagnosis Date  . Pelvic pain in female   . Asthma   . Frequency of urination   . Urgency of urination   . Nocturia   . Reflux OCCASIONAL-- WATCHES DIET  . Interstitial cystitis    Past Surgical History  Procedure Laterality Date  . Wisdom tooth extraction  AGE 89  . Removal right wrist cyst  2011  . Cysto with hydrodistension  08/02/2012    Procedure: CYSTOSCOPY/HYDRODISTENSION;  Surgeon: Martina Sinner, MD;  Location: [facility];  Service: Urology;  Laterality: N/A;  Cystoscopy/Hydrodistension Instillation of Marcaine and Pyridum  . Bladder extension    . Tonsillectomy     History reviewed.  No pertinent family history. Social History  Substance Use Topics  . Smoking status: Never Smoker   . Smokeless tobacco: Never Used  . Alcohol Use: Yes     Comment: OCCASIONAL   OB History    No data available     Review of Systems  Genitourinary: Positive for flank pain.  All other systems reviewed and are negative.     Allergies  Review of patient's allergies indicates no known allergies.  Home Medications   Prior to Admission medications   Medication Sig Start Date End Date Taking? Authorizing Provider  acetaminophen (TYLENOL) 325 MG tablet Take 650 mg by mouth every 6 (six) hours as needed for moderate pain or headache.   Yes Historical Provider, MD  cetirizine (ZYRTEC) 10 MG tablet Take 10 mg by mouth daily as needed for allergies.   Yes Historical Provider, MD  Cholecalciferol (VITAMIN D3) 3000 units TABS Take 3,000 Units by mouth daily.   Yes Historical Provider, MD  Cyanocobalamin (VITAMIN B 12 PO) Take 1 tablet by mouth daily.   Yes Historical Provider, MD  fluticasone (FLONASE) 50 MCG/ACT nasal spray Place 2 sprays into both nostrils daily as needed for allergies or rhinitis.   Yes Historical Provider, MD  Fluticasone-Salmeterol (ADVAIR DISKUS) 100-50 MCG/DOSE AEPB Inhale 1 puff into the lungs 2 (two) times daily.   Yes Historical Provider, MD  HYDROcodone-acetaminophen (NORCO/VICODIN) 5-325 MG tablet Take 2 tablets by mouth every 4 (four) hours as needed for moderate pain. 10/28/15  Yes Gilda Crease, MD  ibuprofen (ADVIL,MOTRIN) 200 MG tablet Take 400 mg by mouth every 6 (six) hours as needed for headache, mild pain or moderate pain.   Yes Historical Provider, MD  Levonorgestrel-Ethinyl Estradiol (CAMRESE) 0.15-0.03 &0.01 MG tablet Take 1 tablet by mouth daily.   Yes Historical Provider, MD  Magnesium 100 MG CAPS Take 100 mg by mouth daily.   Yes Historical Provider, MD  Multiple Vitamin (MULTIVITAMIN) tablet Take 1 tablet by mouth daily.   Yes Historical  Provider, MD  nitrofurantoin, macrocrystal-monohydrate, (MACROBID) 100 MG capsule Take 1 capsule (100 mg total) by mouth 2 (two) times daily. X 7 days 10/28/15  Yes Gilda Crease, MD  Omega 3 1000 MG CAPS Take 1,000 mg by mouth daily.   Yes Historical Provider, MD  ondansetron (ZOFRAN) 4 MG tablet Take 1 tablet (4 mg total) by mouth every 6 (six) hours. 10/28/15  Yes Raeford Razor, MD  PROAIR HFA 108 [phone] Base) MCG/ACT inhaler Inhale 2 puffs into the lungs every 6 (six) hours as needed for wheezing or shortness of breath.  10/07/15  Yes Historical Provider, MD   BP 134/108 mmHg  Pulse 104  Temp(Src) 97.7 F (36.5 C) (Oral)  Resp 20  Ht  (1.702 m)  Wt 187 lb (84.823 kg)  BMI 29.28 kg/m2  SpO2 100%  LMP 09/29/2015 Physical Exam  Constitutional: She is oriented to person, place, and time. She appears well-developed and well-nourished. She appears distressed.  Appears uncomfortable on her side  HENT:  Head: Normocephalic and atraumatic.  Mouth/Throat: Oropharynx is clear and moist.  Eyes: Conjunctivae and EOM are normal. Pupils are equal, round, and reactive to light.  Neck: Normal range of motion. Neck supple.  Cardiovascular: Regular rhythm and intact distal pulses.  Tachycardia present.   No murmur heard. Pulmonary/Chest: Effort normal and breath sounds normal. No respiratory distress. She has no wheezes. She has no rales.  Abdominal: Soft. She exhibits no distension. There is tenderness. There is guarding. There is no rebound.  Significant left flank pain with guarding. No suprapubic tenderness  Genitourinary: Uterus normal. Cervix exhibits no motion tenderness, no discharge and no friability. Right adnexum displays no mass, no tenderness and no fullness. Left adnexum displays tenderness. Left adnexum displays no mass. There is bleeding in the vagina.  Small amount of bloody mucus coming from the cervical loss  Musculoskeletal: Normal range of motion. She exhibits no edema or  tenderness.  Neurological: She is alert and oriented to person, place, and time.  Skin: Skin is warm and dry. No rash noted. No erythema.  Psychiatric: She has a normal mood and affect. Her behavior is normal.  Nursing note and vitals reviewed.   ED Course  Procedures (including critical care time) Labs Review Labs Reviewed  CBC WITH DIFFERENTIAL/PLATELET - Abnormal; Notable for the following:    WBC 10.6 (*)    Monocytes Absolute 1.1 (*)    All other components within normal limits  COMPREHENSIVE METABOLIC PANEL - Abnormal; Notable for the following:    Sodium 129 (*)    Potassium 3.4 (*)    Chloride 97 (*)    CO2 19 (*)    BUN <5 (*)    Calcium 8.8 (*)    AST 45 (*)    ALT 96 (*)    All other components within normal limits  URINALYSIS, ROUTINE W REFLEX MICROSCOPIC (NOT AT [facility]) - Abnormal; Notable for the following:    Specific Gravity, Urine 1.002 (*)    Hgb urine dipstick  SMALL (*)    Ketones, ur 40 (*)    All other components within normal limits  URINE MICROSCOPIC-ADD ON - Abnormal; Notable for the following:    Bacteria, UA RARE (*)    All other components within normal limits  URINE CULTURE  I-STAT CG4 LACTIC ACID, ED    Imaging Review No results found. I have personally reviewed and evaluated these images and lab results as part of my medical decision-making.   EKG Interpretation None      MDM   Final diagnoses:  None    Patient is a 30 year old female with a history of interstitial cystitis who was in the emergency room on 10/28/2015 with symptoms of flank pain, hematuria and dysuria. Labs done when she was here on the eighth were within normal limits and urine showed large hemoglobin without significant white blood cells. She was started on an antibiotic for concern for potential infection but had a negative CT for stone things or hydronephrosis. Patient has no appointment with her urologist on Monday however her symptoms have gradually worsened now  with more significant left flank pain and occasional spasm in her urethra with blood present every time she urinates. She has never had symptoms like this in the past. She denies any fever but has had nausea and decreased appetite. She initially thought she may be constipated so has been taking a stool softener but has passed stools without improvement in symptoms.  Tonight patient appears extremely uncomfortable. She is tachycardic with a normal blood pressure. She has significant left flank pain without significant suprapubic tenderness. CBC shows a mild increase in white blood cell count of 10. CMP, UA and lactic acid are pending. Patient's culture results from the eighth showed multiple species and recommended we culture. Patient given IV fluid and pain management.  11:05 PM On reevaluation patient did mention that 9 days ago she had pain with intercourse and then pain in her back similar to pain she is having now and saw her OB/GYN and had a pelvic ultrasound which was negative. Will do a pelvic exam to further evaluate.  CMP with mild elevation of LFTs which could be from excessive Tylenol use as she has been taking Tylenol and pain medication she denies any alcohol use and no prior history of LFT elevation.  If patient has no pelvic tenderness then will do a abdominal ultrasound however she has pelvic tenderness will do a pelvic ultrasound.  Patient has significant left adnexal tenderness and given the now prior history concern for ruptured cyst versus ovarian torsion. Patient does have some minimal vaginal bleeding which may be where the blood has come from in her urine. She has no evidence of lesions concerning for herpes. Pelvic ultrasound pending  Gwyneth SproutWhitney Persephonie Hegwood, MD 11/01/15 1554

## 2015-11-01 LAB — PREGNANCY, URINE: Preg Test, Ur: NEGATIVE

## 2015-11-01 MED ORDER — HYDROCODONE-ACETAMINOPHEN 5-325 MG PO TABS: 1.0000 | ORAL_TABLET | Freq: Four times a day (QID) | ORAL | Status: DC | PRN

## 2015-11-01 MED ORDER — ONDANSETRON 4 MG PO TBDP: 4.0000 mg | ORAL_TABLET | Freq: Three times a day (TID) | ORAL | Status: DC | PRN

## 2015-11-01 NOTE — ED Provider Notes (Signed)
This patient's care was assumed from Gwyneth Sprout, MD at shift change. Please see her note for further.  Briefly, the patient presented with left flank pain. Patient has had recent CT scan and negative hCG. She came to the emergency department because her pain has worsened tonight. She has follow-up with her urologist on Monday. At shift change patient is awaiting results of her pelvic ultrasound to rule out ovarian mass or torsion. Plan is for discharge of patient's ultrasound is unremarkable.  Ultrasound is unremarkable. Left-sided ovary is normal. No expiration for left-sided pelvic pain. Normal uterus.  At my evaluation patient reports her pain is better, although still has some pain. She feels comfortable with discharge. We'll discharge with prescription for Norco and Zofran. I encouraged her to keep her follow-up appointment with her urologist in 2 days. I discussed her concerns pacific return precautions. I advised the patient to follow-up with their primary care provider this week. I advised the patient to return to the emergency department with new or worsening symptoms or new concerns. The patient verbalized understanding and agreement with plan.      Results for orders placed or performed during the hospital encounter of 10/31/15  Wet prep, genital  Result Value Ref Range   Yeast Wet Prep HPF POC NONE SEEN NONE SEEN   Trich, Wet Prep NONE SEEN NONE SEEN   Clue Cells Wet Prep HPF POC NONE SEEN NONE SEEN   WBC, Wet Prep HPF POC FEW (A) NONE SEEN   Sperm NONE SEEN   CBC with Differential/Platelet  Result Value Ref Range   WBC 10.6 (H) 4.0 - 10.5 K/uL   RBC 4.72 3.87 - 5.11 MIL/uL   Hemoglobin 14.0 12.0 - 15.0 g/dL   HCT 09.8 11.9 - 14.7 %   MCV 84.7 78.0 - 100.0 fL   MCH 29.7 26.0 - 34.0 pg   MCHC 35.0 30.0 - 36.0 g/dL   RDW 82.9 56.2 - 13.0 %   Platelets 295 150 - 400 K/uL   Neutrophils Relative % 56 %   Neutro Abs 5.9 1.7 - 7.7 K/uL   Lymphocytes Relative 33 %   Lymphs  Abs 3.5 0.7 - 4.0 K/uL   Monocytes Relative 10 %   Monocytes Absolute 1.1 (H) 0.1 - 1.0 K/uL   Eosinophils Relative 1 %   Eosinophils Absolute 0.1 0.0 - 0.7 K/uL   Basophils Relative 0 %   Basophils Absolute 0.0 0.0 - 0.1 K/uL  Comprehensive metabolic panel  Result Value Ref Range   Sodium 129 (L) 135 - 145 mmol/L   Potassium 3.4 (L) 3.5 - 5.1 mmol/L   Chloride 97 (L) 101 - 111 mmol/L   CO2 19 (L) 22 - 32 mmol/L   Glucose, Bld 95 65 - 99 mg/dL   BUN <5 (L) 6 - 20 mg/dL   Creatinine, Ser 8.65 0.44 - 1.00 mg/dL   Calcium 8.8 (L) 8.9 - 10.3 mg/dL   Total Protein 7.7 6.5 - 8.1 g/dL   Albumin 4.4 3.5 - 5.0 g/dL   AST 45 (H) 15 - 41 U/L   ALT 96 (H) 14 - 54 U/L   Alkaline Phosphatase 52 38 - 126 U/L   Total Bilirubin 1.1 0.3 - 1.2 mg/dL   GFR calc non Af Amer >60 >60 mL/min   GFR calc Af Amer >60 >60 mL/min   Anion gap 13 5 - 15  Urinalysis, Routine w reflex microscopic (not at [facility])  Result Value Ref Range   Color,  Urine YELLOW YELLOW   APPearance CLEAR CLEAR   Specific Gravity, Urine 1.002 (L) 1.005 - 1.030   pH 6.5 5.0 - 8.0   Glucose, UA NEGATIVE NEGATIVE mg/dL   Hgb urine dipstick SMALL (A) NEGATIVE   Bilirubin Urine NEGATIVE NEGATIVE   Ketones, ur 40 (A) NEGATIVE mg/dL   Protein, ur NEGATIVE NEGATIVE mg/dL   Nitrite NEGATIVE NEGATIVE   Leukocytes, UA NEGATIVE NEGATIVE  Urine microscopic-add on  Result Value Ref Range   Squamous Epithelial / LPF NONE SEEN NONE SEEN   WBC, UA 0-5 0 - 5 WBC/hpf   RBC / HPF NONE SEEN 0 - 5 RBC/hpf   Bacteria, UA RARE (A) NONE SEEN  Pregnancy, urine  Result Value Ref Range   Preg Test, Ur NEGATIVE NEGATIVE  I-Stat CG4 Lactic Acid, ED  Result Value Ref Range   Lactic Acid, Venous 1.75 0.5 - 2.0 mmol/L   US Soft Tissue Head/neck  10/02/2015  CLINICAL DATA:  30 year old with palpable thyromegaly on clinical examination. EXAM: THYROID ULTRASOUND TECHNIQUE: Ultrasound examination of the thyroid gland and adjacent soft tissues was performed.  COMPARISON:  None. FINDINGS: Right thyroid lobe Measurements: Approximately 5.9 x 1.4 x 2.1 cm. Multiple anechoic nodules, many of which have internal echogenic foci. Largest nodule measures approximately 7 mm. No definite solid nodule. Left thyroid lobe Measurements: Approximately 4.8 x 1.3 x 2.1 cm. Multiple anechoic nodules, many of which have internal echogenic foci. Largest nodule measures approximately 7 mm. No definite solid nodule. Isthmus Thickness: Approximately 0.3 cm.  No nodules visualized. Lymphadenopathy None visualized. IMPRESSION: Mild thyromegaly with multiple likely benign colloid cysts throughout both thyroid lobes, the largest measuring approximately 7 mm. Therefore, none of the nodules meet criteria for biopsy. Electronically Signed   By: Hulan Saas M.D.   On: 10/02/2015 17:34   US Transvaginal Non-ob  11/01/2015  CLINICAL DATA:  Left adnexal pain for 2 weeks.  Evaluate for torsion EXAM: TRANSABDOMINAL AND TRANSVAGINAL ULTRASOUND OF PELVIS DOPPLER ULTRASOUND OF OVARIES TECHNIQUE: Both transabdominal and transvaginal ultrasound examinations of the pelvis were performed. Transabdominal technique was performed for global imaging of the pelvis including uterus, ovaries, adnexal regions, and pelvic cul-de-sac. It was necessary to proceed with endovaginal exam following the transabdominal exam to visualize the ovaries. Color and duplex Doppler ultrasound was utilized to evaluate blood flow to the ovaries. COMPARISON:  CT 10/28/2015 FINDINGS: Uterus Measurements: 7 x 3 x 4 cm. No fibroids or other mass visualized. Endometrium Thickness: 5 mm.  No focal abnormality visualized. Right ovary Not visible Left ovary Measurements: 32 x 12 x 16 mm. Normal appearance/no adnexal mass. Pulsed Doppler evaluation of visible left ovary demonstrates normal low-resistance arterial and venous waveforms. Other findings No abnormal free fluid. IMPRESSION: 1. On the symptomatic left side the ovary is normal. No  explanation for left pelvic pain. 2. Normal uterus. 3. Right ovary not seen. Electronically Signed   By: Marnee Spring M.D.   On: 11/01/2015 01:17   US Pelvis Complete  11/01/2015  CLINICAL DATA:  Left adnexal pain for 2 weeks.  Evaluate for torsion EXAM: TRANSABDOMINAL AND TRANSVAGINAL ULTRASOUND OF PELVIS DOPPLER ULTRASOUND OF OVARIES TECHNIQUE: Both transabdominal and transvaginal ultrasound examinations of the pelvis were performed. Transabdominal technique was performed for global imaging of the pelvis including uterus, ovaries, adnexal regions, and pelvic cul-de-sac. It was necessary to proceed with endovaginal exam following the transabdominal exam to visualize the ovaries. Color and duplex Doppler ultrasound was utilized to evaluate blood flow to the ovaries.  COMPARISON:  CT 10/28/2015 FINDINGS: Uterus Measurements: 7 x 3 x 4 cm. No fibroids or other mass visualized. Endometrium Thickness: 5 mm.  No focal abnormality visualized. Right ovary Not visible Left ovary Measurements: 32 x 12 x 16 mm. Normal appearance/no adnexal mass. Pulsed Doppler evaluation of visible left ovary demonstrates normal low-resistance arterial and venous waveforms. Other findings No abnormal free fluid. IMPRESSION: 1. On the symptomatic left side the ovary is normal. No explanation for left pelvic pain. 2. Normal uterus. 3. Right ovary not seen. Electronically Signed   By: Marnee SpringJonathon  Watts M.D.   On: 11/01/2015 01:17   [place] Art/ven Flow Abd Pelv Doppler  11/01/2015  CLINICAL DATA:  Left adnexal pain for 2 weeks.  Evaluate for torsion EXAM: TRANSABDOMINAL AND TRANSVAGINAL ULTRASOUND OF PELVIS DOPPLER ULTRASOUND OF OVARIES TECHNIQUE: Both transabdominal and transvaginal ultrasound examinations of the pelvis were performed. Transabdominal technique was performed for global imaging of the pelvis including uterus, ovaries, adnexal regions, and pelvic cul-de-sac. It was necessary to proceed with endovaginal exam following the  transabdominal exam to visualize the ovaries. Color and duplex Doppler ultrasound was utilized to evaluate blood flow to the ovaries. COMPARISON:  CT 10/28/2015 FINDINGS: Uterus Measurements: 7 x 3 x 4 cm. No fibroids or other mass visualized. Endometrium Thickness: 5 mm.  No focal abnormality visualized. Right ovary Not visible Left ovary Measurements: 32 x 12 x 16 mm. Normal appearance/no adnexal mass. Pulsed Doppler evaluation of visible left ovary demonstrates normal low-resistance arterial and venous waveforms. Other findings No abnormal free fluid. IMPRESSION: 1. On the symptomatic left side the ovary is normal. No explanation for left pelvic pain. 2. Normal uterus. 3. Right ovary not seen. Electronically Signed   By: Marnee SpringJonathon  Watts M.D.   On: 11/01/2015 01:17   Ct Renal Stone Study  10/28/2015  CLINICAL DATA:  Bilateral flank pain and burning. Nausea and chills. Pain worse on the left. Urinary frequency and urgency. EXAM: CT ABDOMEN AND PELVIS WITHOUT CONTRAST TECHNIQUE: Multidetector CT imaging of the abdomen and pelvis was performed following the standard protocol without IV contrast. COMPARISON:  07/05/2012 FINDINGS: Lung bases are clear. Circumscribed low-attenuation lesion in the left cardiophrenic angle likely represents a small pericardial cyst. The lesion is slightly larger than on the previous study. Kidneys are symmetrical in size and shape. No hydronephrosis or hydroureter. No renal, ureteral, or bladder stones. No bladder wall thickening. The unenhanced appearance of the liver, spleen, gallbladder, pancreas, adrenal glands, abdominal aorta, inferior vena cava, and retroperitoneal lymph nodes is unremarkable. The stomach and small bowel are decompressed. Scattered stool in the colon. No colonic distention. No free air or free fluid in the abdomen. Small umbilical hernia containing fat. Pelvis: The appendix is normal. Uterus and ovaries are not enlarged. No free or loculated pelvic fluid  collections. No pelvic mass or lymphadenopathy. No destructive bone lesions. IMPRESSION: No renal or ureteral stone or obstruction. Electronically Signed   By: Burman NievesWilliam  Stevens M.D.   On: 10/28/2015 06:47     Filed Vitals:   10/31/15 2052 10/31/15 2115 11/01/15 0042  BP: 180/103 134/108 127/76  Pulse: 104  92  Temp: 97.7 F (36.5 C)    TempSrc: Oral    Resp: 20  20  Height: 5\' 7"  (1.702 m)    Weight: 84.823 kg    SpO2: 100%  99%    Meds given in ED:  Medications  sodium chloride 0.9 % bolus 1,000 mL (0 mLs Intravenous Stopped 10/31/15 2359)  ondansetron (ZOFRAN) injection  4 mg (4 mg Intravenous Given 10/31/15 2200)  HYDROmorphone (DILAUDID) injection 1 mg (1 mg Intravenous Given 10/31/15 2201)    New Prescriptions   HYDROCODONE-ACETAMINOPHEN (NORCO/VICODIN) 5-325 MG TABLET    Take 1-2 tablets by mouth every 6 (six) hours as needed for moderate pain or severe pain.   ONDANSETRON (ZOFRAN ODT) 4 MG DISINTEGRATING TABLET    Take 1 tablet (4 mg total) by mouth every 8 (eight) hours as needed for nausea or vomiting.    Left adnexal tenderness - Plan: US Pelvis Complete, US Pelvis Complete, [place] Art/Ven Flow Abd Pelv Doppler, [place] Art/Ven Flow Abd Pelv Doppler  Left flank pain    Everlene Farrier, PA-C 11/01/15 0143  Gwyneth Sprout, MD 11/01/15 1728

## 2015-11-01 NOTE — Discharge Instructions (Signed)
Flank Pain °Flank pain refers to pain that is located on the side of the body between the upper abdomen and the back. The pain may occur over a short period of time (acute) or may be long-term or reoccurring (chronic). It may be mild or severe. Flank pain can be caused by many things. °CAUSES  °Some of the more common causes of flank pain include: °· Muscle strains.   °· Muscle spasms.   °· A disease of your spine (vertebral disk disease).   °· A lung infection (pneumonia).   °· Fluid around your lungs (pulmonary edema).   °· A kidney infection.   °· Kidney stones.   °· A very painful skin rash caused by the chickenpox virus (shingles).   °· Gallbladder disease.   °HOME CARE INSTRUCTIONS  °Home care will depend on the cause of your pain. In general, °· Rest as directed by your caregiver. °· Drink enough fluids to keep your urine clear or pale yellow. °· Only take over-the-counter or prescription medicines as directed by your caregiver. Some medicines may help relieve the pain. °· Tell your caregiver about any changes in your pain. °· Follow up with your caregiver as directed. °SEEK IMMEDIATE MEDICAL CARE IF:  °· Your pain is not controlled with medicine.   °· You have new or worsening symptoms. °· Your pain increases.   °· You have abdominal pain.   °· You have shortness of breath.   °· You have persistent nausea or vomiting.   °· You have swelling in your abdomen.   °· You feel faint or pass out.   °· You have blood in your urine. °· You have a fever or persistent symptoms for more than 2-3 days. °· You have a fever and your symptoms suddenly get worse. °MAKE SURE YOU:  °· Understand these instructions. °· Will watch your condition. °· Will get help right away if you are not doing well or get worse. °  °This information is not intended to replace advice given to you by your health care provider. Make sure you discuss any questions you have with your health care provider. °  °Document Released: 09/29/2005 Document  Revised: 05/02/2012 Document Reviewed: 03/22/2012 °Elsevier Interactive Patient Education ©2016 [employer] ° °

## 2015-11-02 LAB — URINE CULTURE: Culture: 6000

## 2015-11-02 LAB — GC/CHLAMYDIA PROBE AMP (~~LOC~~) NOT AT ARMC
Chlamydia: NEGATIVE
Neisseria Gonorrhea: NEGATIVE

## 2015-11-04 ENCOUNTER — Emergency Department (HOSPITAL_COMMUNITY)
Admission: EM | Admit: 2015-11-04 | Discharge: 2015-11-04 | Disposition: A | Discharge status: Home / Self Care | Payer: 59 | Attending: Emergency Medicine | Admitting: Emergency Medicine

## 2015-11-04 ENCOUNTER — Emergency Department (HOSPITAL_COMMUNITY): Payer: 59

## 2015-11-04 ENCOUNTER — Encounter (HOSPITAL_COMMUNITY): Payer: Self-pay | Admitting: Emergency Medicine

## 2015-11-04 DIAGNOSIS — K219 Gastro-esophageal reflux disease without esophagitis: Secondary | ICD-10-CM | POA: Diagnosis not present

## 2015-11-04 DIAGNOSIS — Z79899 Other long term (current) drug therapy: Secondary | ICD-10-CM | POA: Insufficient documentation

## 2015-11-04 DIAGNOSIS — Z7951 Long term (current) use of inhaled steroids: Secondary | ICD-10-CM | POA: Insufficient documentation

## 2015-11-04 DIAGNOSIS — Z793 Long term (current) use of hormonal contraceptives: Secondary | ICD-10-CM | POA: Diagnosis not present

## 2015-11-04 DIAGNOSIS — R079 Chest pain, unspecified: Secondary | ICD-10-CM | POA: Insufficient documentation

## 2015-11-04 DIAGNOSIS — Z87448 Personal history of other diseases of urinary system: Secondary | ICD-10-CM | POA: Diagnosis not present

## 2015-11-04 DIAGNOSIS — F419 Anxiety disorder, unspecified: Secondary | ICD-10-CM | POA: Insufficient documentation

## 2015-11-04 DIAGNOSIS — J45909 Unspecified asthma, uncomplicated: Secondary | ICD-10-CM | POA: Insufficient documentation

## 2015-11-04 LAB — BASIC METABOLIC PANEL
Anion gap: 12 (ref 5–15)
BUN: 5 mg/dL — AB (ref 6–20)
CO2: 21 mmol/L — ABNORMAL LOW (ref 22–32)
CREATININE: 0.86 mg/dL (ref 0.44–1.00)
Calcium: 9.3 mg/dL (ref 8.9–10.3)
Chloride: 107 mmol/L (ref 101–111)
Glucose, Bld: 136 mg/dL — ABNORMAL HIGH (ref 65–99)
POTASSIUM: 3.5 mmol/L (ref 3.5–5.1)
SODIUM: 140 mmol/L (ref 135–145)

## 2015-11-04 LAB — I-STAT TROPONIN, ED: Troponin i, poc: 0 ng/mL (ref 0.00–0.08)

## 2015-11-04 LAB — CBC
HEMATOCRIT: 41.3 % (ref 36.0–46.0)
HEMOGLOBIN: 14.2 g/dL (ref 12.0–15.0)
MCH: 29.6 pg (ref 26.0–34.0)
MCHC: 34.4 g/dL (ref 30.0–36.0)
MCV: 86 fL (ref 78.0–100.0)
Platelets: 302 10*3/uL (ref 150–400)
RBC: 4.8 MIL/uL (ref 3.87–5.11)
RDW: 12.8 % (ref 11.5–15.5)
WBC: 8.1 10*3/uL (ref 4.0–10.5)

## 2015-11-04 LAB — D-DIMER, QUANTITATIVE: D-Dimer, Quant: <0.27 ug/mL-FEU (ref 0.00–0.50)

## 2015-11-04 MED ORDER — TRAMADOL HCL 50 MG PO TABS
50.0000 mg | ORAL_TABLET | Freq: Once | ORAL | Status: AC
  Administered 2015-11-04: 50 mg via ORAL
  Filled 2015-11-04: qty 1

## 2015-11-04 NOTE — ED Notes (Signed)
Pt able to eat saltine crackers and drink water with no difficulty or nausea.

## 2015-11-04 NOTE — Discharge Instructions (Signed)
Use your tramadol as needed for pain. Consider trying an antacid like Tums, or Maalox before meals and at bedtime.   Nonspecific Chest Pain  Chest pain can be caused by many different conditions. There is always a chance that your pain could be related to something serious, such as a heart attack or a blood clot in your lungs. Chest pain can also be caused by conditions that are not life-threatening. If you have chest pain, it is very important to follow up with your health care provider. CAUSES  Chest pain can be caused by:  Heartburn.  Pneumonia or bronchitis.  Anxiety or stress.  Inflammation around your heart (pericarditis) or lung (pleuritis or pleurisy).  A blood clot in your lung.  A collapsed lung (pneumothorax). It can develop suddenly on its own (spontaneous pneumothorax) or from trauma to the chest.  Shingles infection (varicella-zoster virus).  Heart attack.  Damage to the bones, muscles, and cartilage that make up your chest wall. This can include:  Bruised bones due to injury.  Strained muscles or cartilage due to frequent or repeated coughing or overwork.  Fracture to one or more ribs.  Sore cartilage due to inflammation (costochondritis). RISK FACTORS  Risk factors for chest pain may include:  Activities that increase your risk for trauma or injury to your chest.  Respiratory infections or conditions that cause frequent coughing.  Medical conditions or overeating that can cause heartburn.  Heart disease or family history of heart disease.  Conditions or health behaviors that increase your risk of developing a blood clot.  Having had chicken pox (varicella zoster). SIGNS AND SYMPTOMS Chest pain can feel like:  Burning or tingling on the surface of your chest or deep in your chest.  Crushing, pressure, aching, or squeezing pain.  Dull or sharp pain that is worse when you move, cough, or take a deep breath.  Pain that is also felt in your back,  neck, shoulder, or arm, or pain that spreads to any of these areas. Your chest pain may come and go, or it may stay constant. DIAGNOSIS Lab tests or other studies may be needed to find the cause of your pain. Your health care provider may have you take a test called an ambulatory ECG (electrocardiogram). An ECG records your heartbeat patterns at the time the test is performed. You may also have other tests, such as:  Transthoracic echocardiogram (TTE). During echocardiography, sound waves are used to create a picture of all of the heart structures and to look at how blood flows through your heart.  Transesophageal echocardiogram (TEE).This is a more advanced imaging test that obtains images from inside your body. It allows your health care provider to see your heart in finer detail.  Cardiac monitoring. This allows your health care provider to monitor your heart rate and rhythm in real time.  Holter monitor. This is a portable device that records your heartbeat and can help to diagnose abnormal heartbeats. It allows your health care provider to track your heart activity for several days, if needed.  Stress tests. These can be done through exercise or by taking medicine that makes your heart beat more quickly.  Blood tests.  Imaging tests. TREATMENT  Your treatment depends on what is causing your chest pain. Treatment may include:  Medicines. These may include:  Acid blockers for heartburn.  Anti-inflammatory medicine.  Pain medicine for inflammatory conditions.  Antibiotic medicine, if an infection is present.  Medicines to dissolve blood clots.  Medicines to  treat coronary artery disease.  Supportive care for conditions that do not require medicines. This may include:  Resting.  Applying heat or cold packs to injured areas.  Limiting activities until pain decreases. HOME CARE INSTRUCTIONS  If you were prescribed an antibiotic medicine, finish it all even if you start to  feel better.  Avoid any activities that bring on chest pain.  Do not use any tobacco products, including cigarettes, chewing tobacco, or electronic cigarettes. If you need help quitting, ask your health care provider.  Do not drink alcohol.  Take medicines only as directed by your health care provider.  Keep all follow-up visits as directed by your health care provider. This is important. This includes any further testing if your chest pain does not go away.  If heartburn is the cause for your chest pain, you may be told to keep your head raised (elevated) while sleeping. This reduces the chance that acid will go from your stomach into your esophagus.  Make lifestyle changes as directed by your health care provider. These may include:  Getting regular exercise. Ask your health care provider to suggest some activities that are safe for you.  Eating a heart-healthy diet. A registered dietitian can help you to learn healthy eating options.  Maintaining a healthy weight.  Managing diabetes, if necessary.  Reducing stress. SEEK MEDICAL CARE IF:  Your chest pain does not go away after treatment.  You have a rash with blisters on your chest.  You have a fever. SEEK IMMEDIATE MEDICAL CARE IF:   Your chest pain is worse.  You have an increasing cough, or you cough up blood.  You have severe abdominal pain.  You have severe weakness.  You faint.  You have chills.  You have sudden, unexplained chest discomfort.  You have sudden, unexplained discomfort in your arms, back, neck, or jaw.  You have shortness of breath at any time.  You suddenly start to sweat, or your skin gets clammy.  You feel nauseous or you vomit.  You suddenly feel light-headed or dizzy.  Your heart begins to beat quickly, or it feels like it is skipping beats. These symptoms may represent a serious problem that is an emergency. Do not wait to see if the symptoms will go away. Get medical help right  away. Call your local emergency services (911 in the [place] Do not drive yourself to the hospital.   This information is not intended to replace advice given to you by your health care provider. Make sure you discuss any questions you have with your health care provider.   Document Released: 05/18/2005 Document Revised: 08/29/2014 Document Reviewed: 03/14/2014 Elsevier Interactive Patient Education [employer].

## 2015-11-04 NOTE — ED Notes (Signed)
MD at bedside. 

## 2015-11-04 NOTE — ED Provider Notes (Signed)
CSN: [id number]     Arrival date & time 11/04/15  1835 History   First MD Initiated Contact with Patient 11/04/15 2015     Chief Complaint  Patient presents with  . Chest Pain     (Consider location/radiation/quality/duration/timing/severity/associated sxs/prior Treatment) HPI   Sherry Fuentes is a 30 y.o. female here with the concern of chest discomfort that feels like pressure, which started today at 2:30 PM since she awoke from a nap. She has been sleeping more recently because of an illness, primarily lower back pain which is felt to be endometriosis. She was on narcotic analgesia but has not had any in 2 days. She been having trouble eating for about 2 weeks. There's been no nausea, vomiting, weakness or dizziness. She denies fever, chills, cough, or diaphoresis. She feels short of breath today. The pain seems worse when she takes a deep breath. She did not take anything today for the pain. There are no other known modifying factors.    Past Medical History  Diagnosis Date  . Pelvic pain in female   . Asthma   . Frequency of urination   . Urgency of urination   . Nocturia   . Reflux OCCASIONAL-- WATCHES DIET  . Interstitial cystitis    Past Surgical History  Procedure Laterality Date  . Wisdom tooth extraction  AGE 43  . Removal right wrist cyst  2011  . Cysto with hydrodistension  08/02/2012    Procedure: CYSTOSCOPY/HYDRODISTENSION;  Surgeon: Martina Sinner, MD;  Location: [facility];  Service: Urology;  Laterality: N/A;  Cystoscopy/Hydrodistension Instillation of Marcaine and Pyridum  . Bladder extension    . Tonsillectomy     No family history on file. Social History  Substance Use Topics  . Smoking status: Never Smoker   . Smokeless tobacco: Never Used  . Alcohol Use: Yes     Comment: OCCASIONAL   OB History    No data available     Review of Systems  All other systems reviewed and are negative.     Allergies  Review of patient's  allergies indicates no known allergies.  Home Medications   Prior to Admission medications   Medication Sig Start Date End Date Taking? Authorizing Provider  acetaminophen (TYLENOL) 325 MG tablet Take 650 mg by mouth every 6 (six) hours as needed for moderate pain or headache.   Yes Historical Provider, MD  cetirizine (ZYRTEC) 10 MG tablet Take 10 mg by mouth daily as needed for allergies.   Yes Historical Provider, MD  Cholecalciferol (VITAMIN D3) 3000 units TABS Take 3,000 Units by mouth daily.   Yes Historical Provider, MD  Cranberry 450 MG TABS Take 2 tablets by mouth daily.   Yes Historical Provider, MD  Cyanocobalamin (VITAMIN B 12 PO) Take 1 tablet by mouth daily.   Yes Historical Provider, MD  fluticasone (FLONASE) 50 MCG/ACT nasal spray Place 2 sprays into both nostrils daily as needed for allergies or rhinitis.   Yes Historical Provider, MD  Fluticasone-Salmeterol (ADVAIR DISKUS) 100-50 MCG/DOSE AEPB Inhale 1 puff into the lungs 2 (two) times daily.   Yes Historical Provider, MD  HYDROcodone-acetaminophen (NORCO/VICODIN) 5-325 MG tablet Take 1-2 tablets by mouth every 6 (six) hours as needed for moderate pain or severe pain. 11/01/15  Yes Everlene Farrier, PA-C  ibuprofen (ADVIL,MOTRIN) 200 MG tablet Take 400 mg by mouth every 6 (six) hours as needed for headache, mild pain or moderate pain.   Yes Historical Provider, MD  Levonorgestrel-Ethinyl  Estradiol (CAMRESE) 0.15-0.03 &0.01 MG tablet Take 1 tablet by mouth daily.   Yes Historical Provider, MD  Magnesium 100 MG CAPS Take 100 mg by mouth daily.   Yes Historical Provider, MD  Multiple Vitamin (MULTIVITAMIN) tablet Take 1 tablet by mouth daily.   Yes Historical Provider, MD  Omega 3 1000 MG CAPS Take 1,000 mg by mouth daily.   Yes Historical Provider, MD  omeprazole (PRILOSEC) 40 MG capsule Take 40 mg by mouth daily as needed (indigestion).   Yes Historical Provider, MD  ondansetron (ZOFRAN ODT) 4 MG disintegrating tablet Take 1 tablet  (4 mg total) by mouth every 8 (eight) hours as needed for nausea or vomiting. 11/01/15  Yes Everlene Farrier, PA-C  PROAIR HFA 108 [phone] Base) MCG/ACT inhaler Inhale 2 puffs into the lungs every 6 (six) hours as needed for wheezing or shortness of breath.  10/07/15  Yes Historical Provider, MD  Sennosides-Docusate Sodium (STOOL SOFTENER & LAXATIVE PO) Take 1 tablet by mouth daily as needed (constipation).   Yes Historical Provider, MD  traMADol (ULTRAM) 50 MG tablet Take 50 mg by mouth every 6 (six) hours as needed for severe pain.   Yes Historical Provider, MD  nitrofurantoin, macrocrystal-monohydrate, (MACROBID) 100 MG capsule Take 1 capsule (100 mg total) by mouth 2 (two) times daily. X 7 days Patient not taking: Reported on 11/04/2015 10/28/15   Gilda Crease, MD  ondansetron (ZOFRAN) 4 MG tablet Take 1 tablet (4 mg total) by mouth every 6 (six) hours. Patient not taking: Reported on 11/04/2015 10/28/15   Raeford Razor, MD   BP 132/84 mmHg  Pulse 83  Temp(Src) 98.1 F (36.7 C) (Oral)  Resp 16  SpO2 99%  LMP 09/29/2015 Physical Exam  Constitutional: She is oriented to person, place, and time. She appears well-developed and well-nourished. She appears distressed (She is uncomfortable).  HENT:  Head: Normocephalic and atraumatic.  Right Ear: External ear normal.  Left Ear: External ear normal.  Eyes: Conjunctivae and EOM are normal. Pupils are equal, round, and reactive to light.  Neck: Normal range of motion and phonation normal. Neck supple.  Cardiovascular: Normal rate, regular rhythm and normal heart sounds.   Pulmonary/Chest: Effort normal and breath sounds normal. No respiratory distress. She has no wheezes. She has no rales. She exhibits no tenderness and no bony tenderness.  Abdominal: Soft. She exhibits no distension and no mass. There is no tenderness. There is no guarding.  Musculoskeletal: Normal range of motion.  Neurological: She is alert and oriented to person, place, and  time. No cranial nerve deficit or sensory deficit. She exhibits normal muscle tone. Coordination normal.  Skin: Skin is warm, dry and intact.  Psychiatric: Her behavior is normal. Judgment and thought content normal.  She is anxious  Nursing note and vitals reviewed.   ED Course  Procedures (including critical care time)  Medications  traMADol (ULTRAM) tablet 50 mg (50 mg Oral Given 11/04/15 2129)    Patient Vitals for the past 24 hrs:  BP Temp Temp src Pulse Resp SpO2  11/04/15 2230 132/84 mmHg - - 83 16 99 %  11/04/15 2209 131/84 mmHg - - 85 15 99 %  11/04/15 1846 143/81 mmHg 98.1 F (36.7 C) Oral 94 18 100 %    At discharge- Reevaluation with update and discussion. After initial assessment and treatment, an updated evaluation reveals she remains comfortable and feels better after the tramadol. Findings discussed with patient and family members, all questions answered. Gwenith Tschida L  Labs Review Labs Reviewed  BASIC METABOLIC PANEL - Abnormal; Notable for the following:    CO2 21 (*)    Glucose, Bld 136 (*)    BUN 5 (*)    All other components within normal limits  CBC  D-DIMER, QUANTITATIVE (NOT AT [facility])  Rosezena SensorI-STAT TROPOININ, ED    Imaging Review Dg Chest 2 View  11/04/2015  CLINICAL DATA:  30 year old female with intermittent chest pain and pressure for the past week. Increasing symptoms earlier today. EXAM: CHEST  2 VIEW COMPARISON:  No priors. FINDINGS: Lung volumes are normal. No consolidative airspace disease. No pleural effusions. No pneumothorax. No pulmonary nodule or mass noted. Pulmonary vasculature and the cardiomediastinal silhouette are within normal limits. IMPRESSION: No radiographic evidence of acute cardiopulmonary disease. Electronically Signed   By: Trudie Reedaniel  Entrikin M.D.   On: 11/04/2015 20:07   I have personally reviewed and evaluated these images and lab results as part of my medical decision-making.   EKG Interpretation   Date/Time:  Wednesday  November 04 2015 18:52:16 EDT Ventricular Rate:  89 PR Interval:  129 QRS Duration: 77 QT Interval:  353 QTC Calculation: 429 R Axis:   74 Text Interpretation:  Sinus rhythm Borderline repolarization abnormality  since last tracing no significant change Confirmed by Effie ShyWENTZ  MD, Allexa Acoff  ([id number]) on 11/04/2015 8:15:57 PM      MDM   Final diagnoses:  Nonspecific chest pain    Nonspecific chest pain. Patient moderately anxious. Possible diagnoses include gastroesophageal reflux, musculoskeletal chest wall, and anxiety. Doubt ACS, PE, pneumonia, or serious bacterial infection.  Nursing Notes Reviewed/ Care Coordinated Applicable Imaging Reviewed Interpretation of Laboratory Data incorporated into ED treatment  The patient appears reasonably screened and/or stabilized for discharge and I doubt any other medical condition or other [facility] requiring further screening, evaluation, or treatment in the ED at this time prior to discharge.  Plan: Home Medications- usual; Home Treatments- rest; return here if the recommended treatment, does not improve the symptoms; Recommended follow up- PCP prn    Mancel BaleElliott Mattilyn Crites, MD 11/04/15 2317

## 2015-11-04 NOTE — ED Notes (Signed)
Per pt, states chests pressure since yesterday-worse today-SOB

## 2015-11-10 ENCOUNTER — Encounter (HOSPITAL_BASED_OUTPATIENT_CLINIC_OR_DEPARTMENT_OTHER): Payer: Self-pay | Admitting: *Deleted

## 2015-11-10 NOTE — ED Notes (Signed)
Patient needed work note 

## 2015-11-10 NOTE — ED Notes (Signed)
Patient needed work note/s

## 2015-11-11 NOTE — H&P (Signed)
De NurseKathryn A Fritchman  DICTATION # [id number] CSN# [id number]   Meriel PicaHOLLAND,Jaylani Mcguinn M, MD 11/11/2015 12:03 PM

## 2015-11-12 ENCOUNTER — Encounter (HOSPITAL_BASED_OUTPATIENT_CLINIC_OR_DEPARTMENT_OTHER): Payer: Self-pay | Admitting: *Deleted

## 2015-11-12 NOTE — Progress Notes (Signed)
NPO AFTER MN.  ARRIVE AT 0600. NEEDS URINE PREG.  CURRENT LAB RESULTS IN CHART AND EPIC.  WILL TAKE PRILOSEC AND ADVAIR INHALER AM DOS W/ SIPS OF WATER.

## 2015-11-12 NOTE — H&P (Signed)
NAMPatton Fuentes:  Chauvin, Terilyn                ACCOUNT NO.:  [id number]  MEDICAL RECORD NO.:  [id number]  LOCATION:                                 FACILITY:  PHYSICIAN:  Duke Salviaichard M. Marcelle OverlieHolland, M.D.DATE OF BIRTH:  Jan 02, 1986  DATE OF ADMISSION:  11/16/2015 DATE OF DISCHARGE:                             HISTORY & PHYSICAL   CHIEF COMPLAINT:  Pelvic pain.  HPI:  A 30 year old, G0, P0, currently on Camrese.  She had a 4-6 week history of mid pelvic and left lower quadrant pain with some breakthrough bleeding on her pills.  Has been seen by our nurse practitioner, in the office after being in the ED.  Initially, CT was done through the ED that showed no evidence of stone.  The appendix was normal.  Uterus and ovaries were not enlarged.  No free fluid was noted. Followup ultrasound here was also normal, but she continues to have significant pelvic pain.  We discussed several options with her including a trial of Lupron or continuous OCPs or followup ultrasound to see if her symptoms will improve.  She is hurting enough even with her tramadol that she would prefer to have __________ which we discussed to rule out other possibilities such as endometriosis, adhesions, etc. This procedure discussed including specific risks related to bleeding, infection, adjacent organ injury, possible need for additional surgery reviewed with her along with her expected recovery time.  PAST MEDICAL HISTORY:  Allergies:  None. Current Medications:  Camrese, Flonase p.r.n., Zyrtec, Prilosec, Advair.  PRIOR SURGICAL HISTORY:  Tonsillectomy, excision of cyst on her wrist, wisdom tooth extraction, and urethral dilatation.  FAMILY HISTORY:  Significant for history of migraine, heart disease, asthma, UTI, arthritis, diabetes, hypertension, and melanoma.  SOCIAL HISTORY:  She is married.  Denies drug or cigarette use, she does drink alcohol socially.  PHYSICAL EXAMINATION:  VITAL SIGNS:  Temp 98.2, blood pressure  120/68. HEENT:  Unremarkable. NECK:  Supple without masses. LUNGS:  Clear. CARDIOVASCULAR:  Regular rate and rhythm without murmurs, rubs, gallops. BREASTS:  Without masses. ABDOMEN:  Soft, flat, nontender.  No rebound.  Vulva, vagina, cervix normal.  Uterus was slightly tender.  No unusual nodularity, slightly tender on the left.  No mass noted.  Right side negative. EXTREMITIES:  Unremarkable. NEUROLOGIC:  Unremarkable.  IMPRESSION:  Acute pelvic pain, CT and ultrasound normal.  PLAN:  Diagnostic laparoscopy.  Procedure and risks discussed as above.     Jakob Kimberlin M. Marcelle OverlieHolland, M.D.     RM[facility]/MEDQ  D:  11/11/2015  T:  11/11/2015  Job:  [id number]

## 2015-11-16 ENCOUNTER — Ambulatory Visit (HOSPITAL_BASED_OUTPATIENT_CLINIC_OR_DEPARTMENT_OTHER): Payer: 59 | Admitting: Anesthesiology

## 2015-11-16 ENCOUNTER — Encounter (HOSPITAL_BASED_OUTPATIENT_CLINIC_OR_DEPARTMENT_OTHER): Payer: Self-pay | Admitting: *Deleted

## 2015-11-16 ENCOUNTER — Encounter (HOSPITAL_BASED_OUTPATIENT_CLINIC_OR_DEPARTMENT_OTHER)
Admission: RE | Disposition: A | Discharge status: Home / Self Care | Payer: Self-pay | Source: Ambulatory Visit | Attending: Obstetrics and Gynecology

## 2015-11-16 ENCOUNTER — Ambulatory Visit (HOSPITAL_BASED_OUTPATIENT_CLINIC_OR_DEPARTMENT_OTHER)
Admission: RE | Admit: 2015-11-16 | Discharge: 2015-11-16 | Disposition: A | Discharge status: Home / Self Care | Payer: 59 | Source: Ambulatory Visit | Attending: Obstetrics and Gynecology | Admitting: Obstetrics and Gynecology

## 2015-11-16 DIAGNOSIS — N736 Female pelvic peritoneal adhesions (postinfective): Secondary | ICD-10-CM | POA: Insufficient documentation

## 2015-11-16 DIAGNOSIS — R102 Pelvic and perineal pain: Secondary | ICD-10-CM | POA: Diagnosis present

## 2015-11-16 DIAGNOSIS — K219 Gastro-esophageal reflux disease without esophagitis: Secondary | ICD-10-CM | POA: Insufficient documentation

## 2015-11-16 DIAGNOSIS — N803 Endometriosis of pelvic peritoneum: Secondary | ICD-10-CM | POA: Insufficient documentation

## 2015-11-16 HISTORY — DX: Gastro-esophageal reflux disease without esophagitis: K21.9

## 2015-11-16 HISTORY — PX: LAPAROSCOPY: SHX197

## 2015-11-16 LAB — POCT PREGNANCY, URINE: Preg Test, Ur: NEGATIVE

## 2015-11-16 SURGERY — LAPAROSCOPY, DIAGNOSTIC
Anesthesia: General

## 2015-11-16 MED ORDER — 0.9 % SODIUM CHLORIDE (POUR BTL) OPTIME
TOPICAL | Status: DC | PRN
  Administered 2015-11-16: 500 mL

## 2015-11-16 MED ORDER — FENTANYL CITRATE (PF) 100 MCG/2ML IJ SOLN
INTRAMUSCULAR | Status: AC
  Filled 2015-11-16: qty 2

## 2015-11-16 MED ORDER — DEXAMETHASONE SODIUM PHOSPHATE 4 MG/ML IJ SOLN
INTRAMUSCULAR | Status: DC | PRN
  Administered 2015-11-16: 10 mg via INTRAVENOUS

## 2015-11-16 MED ORDER — MIDAZOLAM HCL 5 MG/5ML IJ SOLN
INTRAMUSCULAR | Status: DC | PRN
  Administered 2015-11-16: 2 mg via INTRAVENOUS

## 2015-11-16 MED ORDER — ACETAMINOPHEN 325 MG PO TABS
325.0000 mg | ORAL_TABLET | ORAL | Status: DC | PRN
  Filled 2015-11-16: qty 2

## 2015-11-16 MED ORDER — OXYCODONE HCL 5 MG PO TABS
5.0000 mg | ORAL_TABLET | Freq: Once | ORAL | Status: AC | PRN
  Administered 2015-11-16: 5 mg via ORAL
  Filled 2015-11-16: qty 1

## 2015-11-16 MED ORDER — HYDROMORPHONE HCL 1 MG/ML IJ SOLN
0.2500 mg | INTRAMUSCULAR | Status: DC | PRN
  Administered 2015-11-16: 0.5 mg via INTRAVENOUS
  Filled 2015-11-16: qty 1

## 2015-11-16 MED ORDER — HYDROCODONE-ACETAMINOPHEN 5-325 MG PO TABS: 1.0000 | ORAL_TABLET | Freq: Four times a day (QID) | ORAL | Status: DC | PRN

## 2015-11-16 MED ORDER — SUGAMMADEX SODIUM 200 MG/2ML IV SOLN
INTRAVENOUS | Status: DC | PRN
  Administered 2015-11-16: 200 mg via INTRAVENOUS

## 2015-11-16 MED ORDER — PROPOFOL 10 MG/ML IV BOLUS
INTRAVENOUS | Status: AC
  Filled 2015-11-16: qty 20

## 2015-11-16 MED ORDER — MIDAZOLAM HCL 2 MG/2ML IJ SOLN
INTRAMUSCULAR | Status: AC
  Filled 2015-11-16: qty 2

## 2015-11-16 MED ORDER — LACTATED RINGERS IV SOLN
INTRAVENOUS | Status: DC
  Administered 2015-11-16: 07:00:00 via INTRAVENOUS
  Filled 2015-11-16: qty 1000

## 2015-11-16 MED ORDER — BUPIVACAINE HCL (PF) 0.25 % IJ SOLN
INTRAMUSCULAR | Status: DC | PRN
  Administered 2015-11-16: 10 mL

## 2015-11-16 MED ORDER — FENTANYL CITRATE (PF) 100 MCG/2ML IJ SOLN
INTRAMUSCULAR | Status: DC | PRN
  Administered 2015-11-16: 100 ug via INTRAVENOUS
  Administered 2015-11-16: 50 ug via INTRAVENOUS

## 2015-11-16 MED ORDER — ACETAMINOPHEN 160 MG/5ML PO SOLN
325.0000 mg | ORAL | Status: DC | PRN
  Filled 2015-11-16: qty 20.3

## 2015-11-16 MED ORDER — ONDANSETRON HCL 4 MG/2ML IJ SOLN
INTRAMUSCULAR | Status: AC
  Filled 2015-11-16: qty 2

## 2015-11-16 MED ORDER — OXYCODONE HCL 5 MG PO TABS
ORAL_TABLET | ORAL | Status: AC
  Filled 2015-11-16: qty 1

## 2015-11-16 MED ORDER — LIDOCAINE HCL (CARDIAC) 20 MG/ML IV SOLN
INTRAVENOUS | Status: AC
Start: 2015-11-16 — End: 2015-11-16
  Filled 2015-11-16: qty 5

## 2015-11-16 MED ORDER — OXYCODONE HCL 5 MG/5ML PO SOLN
5.0000 mg | Freq: Once | ORAL | Status: AC | PRN
  Filled 2015-11-16: qty 5

## 2015-11-16 MED ORDER — HYDROMORPHONE HCL 1 MG/ML IJ SOLN
INTRAMUSCULAR | Status: AC
  Filled 2015-11-16: qty 1

## 2015-11-16 MED ORDER — SUGAMMADEX SODIUM 200 MG/2ML IV SOLN
INTRAVENOUS | Status: AC
  Filled 2015-11-16: qty 2

## 2015-11-16 MED ORDER — ONDANSETRON HCL 4 MG/2ML IJ SOLN
INTRAMUSCULAR | Status: DC | PRN
  Administered 2015-11-16: 4 mg via INTRAVENOUS

## 2015-11-16 MED ORDER — KETOROLAC TROMETHAMINE 30 MG/ML IJ SOLN
INTRAMUSCULAR | Status: AC
  Filled 2015-11-16: qty 1

## 2015-11-16 MED ORDER — KETOROLAC TROMETHAMINE 30 MG/ML IJ SOLN
INTRAMUSCULAR | Status: DC | PRN
  Administered 2015-11-16: 30 mg via INTRAVENOUS

## 2015-11-16 MED ORDER — PROPOFOL 10 MG/ML IV BOLUS
INTRAVENOUS | Status: DC | PRN
  Administered 2015-11-16: 150 mg via INTRAVENOUS

## 2015-11-16 MED ORDER — LIDOCAINE HCL (CARDIAC) 10 MG/ML IV SOLN
INTRAVENOUS | Status: DC | PRN
  Administered 2015-11-16: 60 mg via INTRAVENOUS

## 2015-11-16 MED ORDER — ROCURONIUM BROMIDE 100 MG/10ML IV SOLN
INTRAVENOUS | Status: DC | PRN
  Administered 2015-11-16: 30 mg via INTRAVENOUS

## 2015-11-16 MED ORDER — DEXAMETHASONE SODIUM PHOSPHATE 10 MG/ML IJ SOLN
INTRAMUSCULAR | Status: AC
  Filled 2015-11-16: qty 1

## 2015-11-16 SURGICAL SUPPLY — 64 items
APPLICATOR COTTON TIP 6IN STRL (MISCELLANEOUS) ×1 IMPLANT
BAG SPEC RTRVL LRG 6X4 10 (ENDOMECHANICALS)
BAG URINE DRAINAGE (UROLOGICAL SUPPLIES) IMPLANT
BANDAGE ADHESIVE 1X3 (GAUZE/BANDAGES/DRESSINGS) ×2 IMPLANT
BLADE SURG 11 STRL SS (BLADE) ×2 IMPLANT
CANISTER SUCTION 2500CC (MISCELLANEOUS) IMPLANT
CATH FOLEY 2WAY SLVR  5CC 14FR (CATHETERS)
CATH FOLEY 2WAY SLVR 5CC 14FR (CATHETERS) IMPLANT
CATH ROBINSON RED A/P 16FR (CATHETERS) ×1 IMPLANT
COVER MAYO STAND STRL (DRAPES) ×2 IMPLANT
DRAPE UNDERBUTTOCKS STRL (DRAPE) ×2 IMPLANT
DRSG OPSITE POSTOP 3X4 (GAUZE/BANDAGES/DRESSINGS) IMPLANT
ELECT REM PT RETURN 9FT ADLT (ELECTROSURGICAL) ×2
ELECTRODE REM PT RTRN 9FT ADLT (ELECTROSURGICAL) ×1 IMPLANT
FILTER SMOKE EVAC LAPAROSHD (FILTER) IMPLANT
GLOVE BIO SURGEON STRL SZ7 (GLOVE) ×4 IMPLANT
GOWN STRL REUS W/ TWL LRG LVL3 (GOWN DISPOSABLE) ×2 IMPLANT
GOWN STRL REUS W/TWL LRG LVL3 (GOWN DISPOSABLE) ×4
KIT ROOM TURNOVER WOR (KITS) ×2 IMPLANT
LEGGING LITHOTOMY PAIR STRL (DRAPES) ×2 IMPLANT
LIQUID BAND (GAUZE/BANDAGES/DRESSINGS) ×1 IMPLANT
NDL HYPO 25X1 1.5 SAFETY (NEEDLE) ×1 IMPLANT
NDL INSUFFLATION 14GA 120MM (NEEDLE) ×1 IMPLANT
NDL INSUFFLATION 14GA 150MM (NEEDLE) IMPLANT
NEEDLE HYPO 25X1 1.5 SAFETY (NEEDLE) ×2 IMPLANT
NEEDLE INSUFFLATION 14GA 120MM (NEEDLE) ×2 IMPLANT
NEEDLE INSUFFLATION 14GA 150MM (NEEDLE) IMPLANT
NS IRRIG 500ML POUR BTL (IV SOLUTION) ×2 IMPLANT
PACK BASIN DAY SURGERY FS (CUSTOM PROCEDURE TRAY) ×2 IMPLANT
PACK LAPAROSCOPY II (CUSTOM PROCEDURE TRAY) ×2 IMPLANT
PAD OB MATERNITY 4.3X12.25 (PERSONAL CARE ITEMS) ×2 IMPLANT
PAD PREP 24X48 CUFFED NSTRL (MISCELLANEOUS) ×2 IMPLANT
PENCIL BUTTON HOLSTER BLD 10FT (ELECTRODE) IMPLANT
POUCH SPECIMEN RETRIEVAL 10MM (ENDOMECHANICALS) IMPLANT
SCALPEL HARMONIC ACE (MISCELLANEOUS) IMPLANT
SCISSORS LAP 5X35 DISP (ENDOMECHANICALS) IMPLANT
SEALER TISSUE G2 CVD JAW 35 (ENDOMECHANICALS) IMPLANT
SEALER TISSUE G2 CVD JAW 45CM (ENDOMECHANICALS) ×1 IMPLANT
SET IRRIG TUBING LAPAROSCOPIC (IRRIGATION / IRRIGATOR) IMPLANT
SOLUTION ANTI FOG 6CC (MISCELLANEOUS) ×2 IMPLANT
STRIP CLOSURE SKIN 1/4X4 (GAUZE/BANDAGES/DRESSINGS) IMPLANT
SUT MNCRL AB 3-0 PS2 18 (SUTURE) IMPLANT
SUT MNCRL AB 4-0 PS2 18 (SUTURE) ×2 IMPLANT
SUT MON AB 2-0 CT1 36 (SUTURE) IMPLANT
SUT PLAIN 4 0 FS 2 27 (SUTURE) IMPLANT
SUT VIC AB 2-0 UR6 27 (SUTURE) ×2 IMPLANT
SUT VIC AB 3-0 SH 27 (SUTURE)
SUT VIC AB 3-0 SH 27X BRD (SUTURE) IMPLANT
SUT VICRYL 0 TIES 12 18 (SUTURE) IMPLANT
SUT VICRYL 0 UR6 27IN ABS (SUTURE) IMPLANT
SUT VICRYL RAPIDE 3 0 (SUTURE) IMPLANT
SUT VICRYL RAPIDE 4/0 PS 2 (SUTURE) IMPLANT
SYR 3ML 23GX1 SAFETY (SYRINGE) IMPLANT
SYR CONTROL 10ML LL (SYRINGE) ×2 IMPLANT
SYRINGE 10CC LL (SYRINGE) ×2 IMPLANT
TOWEL OR 17X24 6PK STRL BLUE (TOWEL DISPOSABLE) ×4 IMPLANT
TRAY DSU PREP LF (CUSTOM PROCEDURE TRAY) ×2 IMPLANT
TROCAR OPTI TIP 5M 100M (ENDOMECHANICALS) ×2 IMPLANT
TROCAR XCEL BLUNT TIP 100MML (ENDOMECHANICALS) IMPLANT
TROCAR XCEL DIL TIP R 11M (ENDOMECHANICALS) ×2 IMPLANT
TUBE CONNECTING 12X1/4 (SUCTIONS) IMPLANT
TUBING INSUFFLATION 10FT LAP (TUBING) ×2 IMPLANT
WARMER LAPAROSCOPE (MISCELLANEOUS) ×2 IMPLANT
WATER STERILE IRR 500ML POUR (IV SOLUTION) ×2 IMPLANT

## 2015-11-16 NOTE — Discharge Instructions (Addendum)
Laparoscopy, Care After Refer to this sheet in the next few weeks. These instructions provide you with information about caring for yourself after your procedure. Your health care provider may also give you more specific instructions. Your treatment has been planned according to current medical practices, but problems sometimes occur. Call your health care provider if you have any problems or questions after your procedure. WHAT TO EXPECT AFTER THE PROCEDURE After your procedure, it is common to have:  Sore throat.  Soreness at the incision site.  Mild cramping.  Tiredness.  Mild nausea or vomiting.  Shoulder pain. HOME CARE INSTRUCTIONS  Rest for the remainder of the day.  Take medicines only as directed by your health care provider. These include over-the-counter medicines and prescription medicines. Do not take aspirin, which can cause bleeding.  Over the next few days, gradually return to your normal activities and your normal diet.  Avoid sexual intercourse for 2 weeks or as directed by your health care provider.  Do not use tampons, and do not douche.  Do not drive or operate heavy machinery while taking pain medicine.  Do not lift anything that is heavier than 5 lb (2.3 kg) for 2 weeks or as directed by your health care provider.  Do not take baths. Take showers only. Ask your health care provider when you can start taking baths.  There are many different ways to close and cover an incision, including stitches (sutures), skin glue, and adhesive strips. Follow instructions from your health care provider about:  Incision care.  Bandage (dressing) changes and removal.  Incision closure removal.  Check your incision area every day for signs of infection. Watch for:  Redness, swelling, or pain.  Fluid, blood, or pus.  Keep all follow-up visits as directed by your health care provider. SEEK MEDICAL CARE IF:  You have redness, swelling, or increasing pain in your  incision area.  You have fluid, blood, or pus coming from your incision for longer than 1 day.  You notice a bad smell coming from your incision or your dressing.  The edges of your incision break open after the sutures have been removed.  Your pain does not decrease after 2-3 days.  You have a rash.  You repeatedly become dizzy or light-headed.  You have a reaction to your medicine.  Your pain medicine is not helping.  You are constipated. SEEK IMMEDIATE MEDICAL CARE IF:  You have a fever.  You faint.  You have increasing pain in your abdomen.  You have severe pain in one or both of your shoulders.  You have bleeding or drainage from your suture sites or your vagina after surgery.  You have shortness of breath or have difficulty breathing.  You have chest pain or leg pain.  You have ongoing nausea, vomiting, or diarrhea.   This information is not intended to replace advice given to you by your health care provider. Make sure you discuss any questions you have with your health care provider.   Document Released: 02/25/2005 Document Revised: 12/23/2014 Document Reviewed: 11/19/2011 Elsevier Interactive Patient Education 2016 [employer]  Post Anesthesia Home Care Instructions  Activity: Get plenty of rest for the remainder of the day. A responsible adult should stay with you for 24 hours following the procedure.  For the next 24 hours, DO NOT: -Drive a car -Advertising copywriterperate machinery -Drink alcoholic beverages -Take any medication unless instructed by your physician -Make any legal decisions or sign important papers.  Meals: Start with liquid  foods such as gelatin or soup. Progress to regular foods as tolerated. Avoid greasy, spicy, heavy foods. If nausea and/or vomiting occur, drink only clear liquids until the nausea and/or vomiting subsides. Call your physician if vomiting continues.  Special Instructions/Symptoms: Your throat may feel dry or sore from the  anesthesia or the breathing tube placed in your throat during surgery. If this causes discomfort, gargle with warm salt water. The discomfort should disappear within 24 hours.  If you had a scopolamine patch placed behind your ear for the management of post- operative nausea and/or vomiting:  1. The medication in the patch is effective for 72 hours, after which it should be removed.  Wrap patch in a tissue and discard in the trash. Wash hands thoroughly with soap and water. 2. You may remove the patch earlier than 72 hours if you experience unpleasant side effects which may include dry mouth, dizziness or visual disturbances. 3. Avoid touching the patch. Wash your hands with soap and water after contact with the patch.

## 2015-11-16 NOTE — Progress Notes (Signed)
The patient was re-examined with no change in status 

## 2015-11-16 NOTE — Anesthesia Postprocedure Evaluation (Signed)
Anesthesia Post Note  Patient: De NurseKathryn A Winchel  Procedure(s) Performed: Procedure(s) (LRB): LAPAROSCOPY DIAGNOSTIC; LYSIS OF ADHESIONS (N/A)  Patient location during evaluation: PACU Anesthesia Type: General Level of consciousness: awake Pain management: pain level controlled Vital Signs Assessment: post-procedure vital signs reviewed and stable Respiratory status: spontaneous breathing and respiratory function stable Cardiovascular status: stable Postop Assessment: no signs of nausea or vomiting Anesthetic complications: no    Last Vitals:  Filed Vitals:   11/16/15 0915 11/16/15 1058  BP: 126/70 124/69  Pulse: 97 101  Temp:  37.1 C  Resp: 19 12    Last Pain:  Filed Vitals:   11/16/15 1059  PainSc: 4                  Myrian Botello

## 2015-11-16 NOTE — Transfer of Care (Signed)
Immediate Anesthesia Transfer of Care Note  Patient: De NurseKathryn A Resor  Procedure(s) Performed: Procedure(s): LAPAROSCOPY DIAGNOSTIC; LYSIS OF ADHESIONS (N/A)  Patient Location: PACU  Anesthesia Type:General  Level of Consciousness: awake, sedated and patient cooperative  Airway & Oxygen Therapy: Patient Spontanous Breathing and Patient connected to face mask oxygen  Post-op Assessment: Report given to RN and Post -op Vital signs reviewed and stable  Post vital signs: Reviewed and stable  Last Vitals:  Filed Vitals:   11/16/15 0613  BP: 128/72  Pulse: 97  Temp: 37.3 C  Resp: 16    Complications: No apparent anesthesia complications

## 2015-11-16 NOTE — Anesthesia Procedure Notes (Signed)
Procedure Name: Intubation Date/Time: 11/16/2015 7:31 AM Performed by: Gar GibbonKEETON, Darleen Moffitt S Pre-anesthesia Checklist: Patient identified, Emergency Drugs available, Suction available and Patient being monitored Patient Re-evaluated:Patient Re-evaluated prior to inductionOxygen Delivery Method: Circle System Utilized Preoxygenation: Pre-oxygenation with 100% oxygen Intubation Type: IV induction Ventilation: Mask ventilation without difficulty Laryngoscope Size: Mac and 3 Grade View: Grade II Tube type: Oral Tube size: 7.0 mm Number of attempts: 1 Airway Equipment and Method: Stylet and Oral airway Placement Confirmation: ETT inserted through vocal cords under direct vision,  positive ETCO2 and breath sounds checked- equal and bilateral Secured at: 20 cm Tube secured with: Tape Dental Injury: Teeth and Oropharynx as per pre-operative assessment

## 2015-11-16 NOTE — Op Note (Signed)
Preoperative diagnosis: Acute pelvic pain, mid and left lower quadrant  Postoperative diagnosis: Same plus pelvic adhesions, pelvic endometriosis  Procedure: Diagnostic laparoscopy with lysis of adhesion surgeon: Marcelle OverlieHolland  Anesthesia: Gen.  EBL: Less than 10 cc  Procedure and findings:  Patient taken the operating room after an adequate level of general anesthesia was obtained the patient's leg stirrups the abdomen perineum and vagina were prepped and draped in usual fashion appropriate timeouts were taken the bladder was drained EUA was carried out uterus is midposition normal size, mobile adnexa negative. Hulka tenaculum position.  Attention directed to the abdomen where the subumbilical area was infiltrated quarter percent Marcaine plain small incision was made in the varies needle was introduced without difficulty. Its intra-abdominal position was verified by pressure water testing. After 2-1/2 L pneumoperitoneum was created, lap scopic trocar and sleeve were then inserted without difficulty. 3 finger breaths above the symphysis in the midline a 5 mm trocar was inserted under direct visualization. The patient was then placed in Trendelenburg and the pelvic findings as follows:  The uterus itself was normal size the serosa appeared to be normal the anterior peritoneum was unremarkable right tube and ovary were normal the fimbria appeared to be normal pelvic sidewall the right was unremarkable was some brownish discoloration the left peritoneum lateral to the uterosacral ligament and a solitary epiploic: Adhesion into that area which was later lysed in an avascular plane with the Enseal device. Similarly there was a solitary adhesion from the left tube to the epiploic K of the colon this was lysed in an avascular plane allowing the tube and ovary to be elevated tube and ovary on that side were unremarkable the surface of both ovaries was normal  The appendix is normal there was some minor adhesion at  the midportion kinking a slightly but the liver edge was normal. My overall impression was some very early stage endometriosis lateral to the left uterosacral ligament. The remainder was unremarkable these areas were photo documented is was removed gas allowed to escape the umbilical incision closed with a 4-0 Monocryl subcuticular Dermabond on the lower incision she tolerated this well went to recovery room in good condition.  Dictated with [facility]  Aiva Miskell Milana ObeyM Mylan Lengyel M.D.

## 2015-11-16 NOTE — Anesthesia Preprocedure Evaluation (Signed)
Anesthesia Evaluation  Patient identified by MRN, date of birth, ID band Patient awake    Reviewed: Allergy & Precautions, NPO status , Patient's Chart, lab work & pertinent test results  History of Anesthesia Complications Negative for: history of anesthetic complications  Airway Mallampati: II  TM Distance: >3 FB Neck ROM: Full    Dental  (+) Teeth Intact   Pulmonary neg shortness of breath, asthma , neg sleep apnea, neg recent URI,    breath sounds clear to auscultation       Cardiovascular negative cardio ROS   Rhythm:Regular     Neuro/Psych negative neurological ROS  negative psych ROS   GI/Hepatic Neg liver ROS, GERD  Medicated and Controlled,  Endo/Other  negative endocrine ROS  Renal/GU negative Renal ROS     Musculoskeletal   Abdominal   Peds  Hematology negative hematology ROS (+)   Anesthesia Other Findings   Reproductive/Obstetrics                             Anesthesia Physical Anesthesia Plan  ASA: II  Anesthesia Plan: General   Post-op Pain Management:    Induction: Intravenous  Airway Management Planned: Oral ETT  Additional Equipment: None  Intra-op Plan:   Post-operative Plan: Extubation in OR  Informed Consent: I have reviewed the patients History and Physical, chart, labs and discussed the procedure including the risks, benefits and alternatives for the proposed anesthesia with the patient or authorized representative who has indicated his/her understanding and acceptance.   Dental advisory given  Plan Discussed with: CRNA and Surgeon  Anesthesia Plan Comments:         Anesthesia Quick Evaluation

## 2015-11-17 ENCOUNTER — Encounter (HOSPITAL_BASED_OUTPATIENT_CLINIC_OR_DEPARTMENT_OTHER): Payer: Self-pay | Admitting: Obstetrics and Gynecology

## 2017-06-15 ENCOUNTER — Encounter (HOSPITAL_COMMUNITY): Payer: Self-pay

## 2017-06-15 ENCOUNTER — Emergency Department (HOSPITAL_COMMUNITY)
Admission: EM | Admit: 2017-06-15 | Discharge: 2017-06-16 | Disposition: A | Discharge status: Other Acute Inpatient Hospital | Payer: 59 | Attending: Emergency Medicine | Admitting: Emergency Medicine

## 2017-06-15 DIAGNOSIS — J45909 Unspecified asthma, uncomplicated: Secondary | ICD-10-CM | POA: Insufficient documentation

## 2017-06-15 DIAGNOSIS — Z79899 Other long term (current) drug therapy: Secondary | ICD-10-CM | POA: Insufficient documentation

## 2017-06-15 DIAGNOSIS — Z008 Encounter for other general examination: Secondary | ICD-10-CM

## 2017-06-15 DIAGNOSIS — Z046 Encounter for general psychiatric examination, requested by authority: Secondary | ICD-10-CM | POA: Insufficient documentation

## 2017-06-15 DIAGNOSIS — F12950 Cannabis use, unspecified with psychotic disorder with delusions: Secondary | ICD-10-CM | POA: Insufficient documentation

## 2017-06-15 DIAGNOSIS — R4182 Altered mental status, unspecified: Secondary | ICD-10-CM | POA: Diagnosis present

## 2017-06-15 LAB — RAPID URINE DRUG SCREEN, HOSP PERFORMED
AMPHETAMINES: NOT DETECTED
BARBITURATES: NOT DETECTED
BENZODIAZEPINES: NOT DETECTED
COCAINE: NOT DETECTED
Opiates: NOT DETECTED
Tetrahydrocannabinol: POSITIVE — AB

## 2017-06-15 LAB — URINALYSIS, ROUTINE W REFLEX MICROSCOPIC
BILIRUBIN URINE: NEGATIVE
Glucose, UA: NEGATIVE mg/dL
Ketones, ur: 20 mg/dL — AB
NITRITE: POSITIVE — AB
PROTEIN: 30 mg/dL — AB
SPECIFIC GRAVITY, URINE: 1.008 (ref 1.005–1.030)
pH: 6 (ref 5.0–8.0)

## 2017-06-15 LAB — COMPREHENSIVE METABOLIC PANEL
ALBUMIN: 4.4 g/dL (ref 3.5–5.0)
ALT: 25 U/L (ref 14–54)
ANION GAP: 14 (ref 5–15)
AST: 24 U/L (ref 15–41)
Alkaline Phosphatase: 75 U/L (ref 38–126)
BILIRUBIN TOTAL: 1.7 mg/dL — AB (ref 0.3–1.2)
BUN: 8 mg/dL (ref 6–20)
CHLORIDE: 100 mmol/L — AB (ref 101–111)
CO2: 22 mmol/L (ref 22–32)
Calcium: 9.2 mg/dL (ref 8.9–10.3)
Creatinine, Ser: 0.68 mg/dL (ref 0.44–1.00)
GFR calc Af Amer: >60 mL/min (ref >60)
GFR calc non Af Amer: >60 mL/min (ref >60)
GLUCOSE: 122 mg/dL — AB (ref 65–99)
POTASSIUM: 3.6 mmol/L (ref 3.5–5.1)
SODIUM: 136 mmol/L (ref 135–145)
TOTAL PROTEIN: 7.9 g/dL (ref 6.5–8.1)

## 2017-06-15 LAB — CBC WITH DIFFERENTIAL/PLATELET
BASOS ABS: 0 10*3/uL (ref 0.0–0.1)
Basophils Relative: 0 %
EOS ABS: 0 10*3/uL (ref 0.0–0.7)
EOS PCT: 0 %
HCT: 39.2 % (ref 36.0–46.0)
Hemoglobin: 13.6 g/dL (ref 12.0–15.0)
LYMPHS PCT: 15 %
Lymphs Abs: 2.4 10*3/uL (ref 0.7–4.0)
MCH: 30.4 pg (ref 26.0–34.0)
MCHC: 34.7 g/dL (ref 30.0–36.0)
MCV: 87.5 fL (ref 78.0–100.0)
MONO ABS: 1.4 10*3/uL — AB (ref 0.1–1.0)
Monocytes Relative: 9 %
Neutro Abs: 12 10*3/uL — ABNORMAL HIGH (ref 1.7–7.7)
Neutrophils Relative %: 76 %
PLATELETS: 339 10*3/uL (ref 150–400)
RBC: 4.48 MIL/uL (ref 3.87–5.11)
RDW: 12.8 % (ref 11.5–15.5)
WBC: 15.8 10*3/uL — ABNORMAL HIGH (ref 4.0–10.5)

## 2017-06-15 LAB — TSH: TSH: 1.185 u[IU]/mL (ref 0.350–4.500)

## 2017-06-15 LAB — ETHANOL: Alcohol, Ethyl (B): <10 mg/dL (ref <10)

## 2017-06-15 LAB — I-STAT BETA HCG BLOOD, ED (MC, WL, AP ONLY)

## 2017-06-15 MED ORDER — LORAZEPAM 2 MG/ML IJ SOLN
2.0000 mg | Freq: Once | INTRAMUSCULAR | Status: AC
  Administered 2017-06-16: 2 mg via INTRAMUSCULAR
  Filled 2017-06-15: qty 1

## 2017-06-15 MED ORDER — ZIPRASIDONE MESYLATE 20 MG IM SOLR
20.0000 mg | Freq: Once | INTRAMUSCULAR | Status: AC
  Administered 2017-06-15: 20 mg via INTRAMUSCULAR

## 2017-06-15 MED ORDER — CEPHALEXIN 500 MG PO CAPS
500.0000 mg | ORAL_CAPSULE | Freq: Three times a day (TID) | ORAL | Status: DC
  Administered 2017-06-15 – 2017-06-16 (×3): 500 mg via ORAL
  Filled 2017-06-15 (×3): qty 1

## 2017-06-15 MED ORDER — HYDROXYZINE HCL 10 MG PO TABS
10.0000 mg | ORAL_TABLET | Freq: Two times a day (BID) | ORAL | Status: DC
  Administered 2017-06-15 – 2017-06-16 (×2): 10 mg via ORAL
  Filled 2017-06-15 (×4): qty 1

## 2017-06-15 MED ORDER — HALOPERIDOL 2 MG PO TABS
2.0000 mg | ORAL_TABLET | Freq: Two times a day (BID) | ORAL | Status: DC
  Administered 2017-06-15 – 2017-06-16 (×2): 2 mg via ORAL
  Filled 2017-06-15 (×2): qty 1

## 2017-06-15 MED ORDER — DIPHENHYDRAMINE HCL 50 MG/ML IJ SOLN
50.0000 mg | Freq: Once | INTRAMUSCULAR | Status: AC
  Administered 2017-06-16: 50 mg via INTRAMUSCULAR
  Filled 2017-06-15: qty 1

## 2017-06-15 MED ORDER — GABAPENTIN 300 MG PO CAPS
300.0000 mg | ORAL_CAPSULE | Freq: Two times a day (BID) | ORAL | Status: DC
  Administered 2017-06-15 – 2017-06-16 (×2): 300 mg via ORAL
  Filled 2017-06-15 (×2): qty 1

## 2017-06-15 MED ORDER — ZIPRASIDONE MESYLATE 20 MG IM SOLR
INTRAMUSCULAR | Status: AC
  Administered 2017-06-15: 12:00:00
  Filled 2017-06-15: qty 20

## 2017-06-15 MED ORDER — STERILE WATER FOR INJECTION IJ SOLN
INTRAMUSCULAR | Status: AC
  Administered 2017-06-15: 12:00:00
  Filled 2017-06-15: qty 10

## 2017-06-15 NOTE — Progress Notes (Signed)
CSW informed by daytime SAPU RN pt has been accepted to [facility].  CSW will call and inform [facility].  7:19 PM CSW spoke to intake at [facility] and informed them pt has been accepted elsewhere first.  [facility] acknowledged this.  Please reconsult if future social work needs arise.  CSW signing off, as social work intervention is no longer needed.  Dorothe PeaJonathan F. Jaydee Conran, LCSW, LCAS, CSI Clinical Social Worker Ph: [phone]
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
## 2017-06-15 NOTE — ED Notes (Signed)
[facility]  [facility]

## 2017-06-15 NOTE — BHH Counselor (Signed)
Pt contacted Tobi Bastosnna at [facility] and informed her that the has been accepted to another facility.   Redmond Pullingreylese D Tashiana Lamarca, MS, [facility], [facility] Triage Specialist [phone]

## 2017-06-15 NOTE — BHH Counselor (Signed)
Clinician received a call from RifleAndrew [facility] requesting the pt's [employer] policy number. Clinician contacted registration at [facility] and obtained the pt's member ID number. Pt called [facility] [phone]) and spoke to Tobi Bastosnna and provided her with the pt's member ID number. Tobi Bastosnna, reported, she will call back if needed.    Redmond Pullingreylese D Talaya Lamprecht, MS, [facility], [facility] Triage Specialist [phone]

## 2017-06-15 NOTE — BHH Counselor (Signed)
Per Donell SievertSpencer Simon, PA-C: Patient meets criteria for inpatient treatment.  Per Hassie BruceAC, Kim, RN, [facility].  TTS to seek placement.  [facility]-EDP, Palumbo, MD, notified at [phone].

## 2017-06-15 NOTE — BH Assessment (Addendum)
[facility]  Per Juanetta BeetsJacqueline Norman, DO, this pt requires psychiatric hospitalization at this time.  Pt presents under IVC initiated by pt's husband, which Dr Sharma CovertNorman has upheld.  Christiane HaJonathan calls from [facility] to report that pt has been accepted to their facility by Dr Wendall StadeKohl.  Dr Sharma CovertNorman concurs with this decision.  Pt's nurse, Kendal Hymendie, has been notified, and agrees to call report to [phone].  [facility]  Doylene Canninghomas Malikai Gut, MA Triage Specialist [phone]

## 2017-06-15 NOTE — ED Provider Notes (Signed)
UA noted. Will treat for UTI. Urine culture. This may be contributing but would be unusual to have symptoms to this degree purely from a UTI in a 31 year old.    Sherry Fuentes, Sherry Tosh, MD 06/15/17 [phone]

## 2017-06-15 NOTE — ED Notes (Signed)
An officer called back stated that patient will be picked up tomorrow at 9 am. Patient was made aware of the plan. Patient initially receptive to that but later told this writer that she does not feel safe going [place]. This Clinical research associatewriter explained to patient that [facility] is [facility] [place]. Patient continues to be nervous and paranoid. Coming to nurses station repeating herself. Accepted her bedtime meds. In bed at this time. Will continue to monitor patient.

## 2017-06-15 NOTE — ED Provider Notes (Signed)
[facility]EMERG[facility] DEPT Provider Note   CSN: [id number] Arrival date & time: 06/15/17  0053     History   Chief Complaint Chief Complaint  Patient presents with  . Medical Clearance    HPI Sherry Fuentes is a 31 y.o. female.  The history is provided by medical records. The history is limited by the condition of the patient.  Mental Health Problem  Presenting symptoms: delusional, disorganized speech, disorganized thought process and paranoid behavior   Patient accompanied by:  Law enforcement Degree of incapacity (severity):  Severe Onset quality:  Unable to specify Timing:  Constant Progression:  Unable to specify Context: drug abuse   Treatment compliance:  Untreated Relieved by:  Nothing Worsened by:  Nothing Associated symptoms: no abdominal pain   Risk factors: no recent psychiatric admission     Past Medical History:  Diagnosis Date  . Asthma   . Frequency of urination   . GERD (gastroesophageal reflux disease)   . Interstitial cystitis   . Nocturia   . Pelvic pain in female   . Urgency of urination     There are no active problems to display for this patient.   Past Surgical History:  Procedure Laterality Date  . CYSTO WITH HYDRODISTENSION  08/02/2012   Procedure: CYSTOSCOPY/HYDRODISTENSION;  Surgeon: Martina Sinner, MD;  [facility];  Service: Urology;  Laterality: N/A;  Cystoscopy/Hydrodistension Instillation of Marcaine and Pyridum  . LAPAROSCOPY N/A 11/16/2015   Procedure: LAPAROSCOPY DIAGNOSTIC; LYSIS OF ADHESIONS;  Surgeon: Richarda Overlie, MD;  Location: [facility];  Service: Gynecology;  Laterality: N/A;  . NEGATIVE SLEEP STUDY  2015  . REMOVAL RIGHT WRIST CYST  2011  . TONSILLECTOMY  2015  . WISDOM TOOTH EXTRACTION  AGE 67    OB History    No data available       Home Medications    Prior to Admission medications   Medication Sig Start Date End Date Taking?  Authorizing Provider  cetirizine (ZYRTEC) 10 MG tablet Take 10 mg by mouth daily as needed for allergies.    [provider]  Cholecalciferol (VITAMIN D-3 PO) Take 2,500 Units by mouth daily.    [provider]  fluticasone (FLONASE) 50 MCG/ACT nasal spray Place 2 sprays into both nostrils daily as needed for allergies or rhinitis.    [provider]  Fluticasone-Salmeterol (ADVAIR DISKUS) 100-50 MCG/DOSE AEPB Inhale 1 puff into the lungs 2 (two) times daily.    [provider]  HYDROcodone-acetaminophen (NORCO) 5-325 MG tablet Take 1 tablet by mouth every 6 (six) hours as needed for moderate pain. 11/16/15   Richarda Overlie, MD  ibuprofen (ADVIL,MOTRIN) 200 MG tablet Take 400 mg by mouth every 6 (six) hours as needed for headache, mild pain or moderate pain.    [provider]  Levonorgestrel-Ethinyl Estradiol (CAMRESE) 0.15-0.03 &0.01 MG tablet Take 1 tablet by mouth every evening.     [provider]  Magnesium 200 MG TABS Take 1 tablet by mouth daily.    [provider]  Multiple Vitamin (MULTIVITAMIN) tablet Take 1 tablet by mouth daily.    [provider]  Omega 3 1000 MG CAPS Take 1,000 mg by mouth daily.    [provider]  omeprazole (PRILOSEC) 40 MG capsule Take 40 mg by mouth daily as needed (indigestion).    [provider]  ondansetron (ZOFRAN ODT) 4 MG disintegrating tablet Take 1 tablet (4 mg total) by mouth every  8 (eight) hours as needed for nausea or vomiting. 11/01/15   Everlene Farrier, PA-C  PROAIR HFA 108 [phone] Base) MCG/ACT inhaler Inhale 2 puffs into the lungs every 6 (six) hours as needed for wheezing or shortness of breath.  10/07/15   [provider]  Sennosides-Docusate Sodium (STOOL SOFTENER & LAXATIVE PO) Take 1 tablet by mouth daily as needed (constipation).    [provider]  traMADol (ULTRAM) 50 MG tablet Take 50 mg by mouth every 6 (six) hours as needed for severe  pain.    [provider]  vitamin B-12 (CYANOCOBALAMIN) 1000 MCG tablet Take 1,000 mcg by mouth daily.    [provider]    Family History History reviewed. No pertinent family history.  Social History Social History  Substance Use Topics  . Smoking status: Never Smoker  . Smokeless tobacco: Never Used  . Alcohol use Yes     Comment: RARE     Allergies   Patient has no known allergies.   Review of Systems Review of Systems  Unable to perform ROS: Psychiatric disorder  Constitutional: Negative for fever.  Respiratory: Negative for shortness of breath.   Gastrointestinal: Negative for abdominal pain.  Psychiatric/Behavioral: Positive for paranoia.     Physical Exam Updated Vital Signs BP 130/81 (BP Location: Right Arm)   Pulse 95   Temp 98.3 F (36.8 C) (Oral)   Resp (!) 22   Ht 5\' 7"  (1.702 m)   Wt 86.2 kg (190 lb)   SpO2 97%   BMI 29.76 kg/m   Physical Exam  Constitutional: She is oriented to person, place, and time. She appears well-developed and well-nourished. No distress.  HENT:  Head: Normocephalic and atraumatic.  Mouth/Throat: No oropharyngeal exudate.  Eyes: Conjunctivae and EOM are normal.  Neck: Normal range of motion. Neck supple.  Cardiovascular: Normal rate, regular rhythm, normal heart sounds and intact distal pulses.   Pulmonary/Chest: Effort normal and breath sounds normal. She has no wheezes. She has no rales.  Abdominal: Soft. Bowel sounds are normal. She exhibits no mass. There is no tenderness. There is no rebound and no guarding.  Musculoskeletal: Normal range of motion.  Neurological: She is alert and oriented to person, place, and time. She displays normal reflexes.  Skin: Skin is warm and dry. Capillary refill takes less than 2 seconds.  Psychiatric: Her speech is rapid and/or pressured and tangential. Thought content is paranoid and delusional. She expresses impulsivity. She expresses no suicidal plans.     ED  Treatments / Results   Vitals:   06/15/17 0142  BP: 130/81  Pulse: 95  Resp: (!) 22  Temp: 98.3 F (36.8 C)  SpO2: 97%    Labs (all labs ordered are listed, but only abnormal results are displayed)  Results for orders placed or performed during the hospital encounter of 06/15/17  Comprehensive metabolic panel  Result Value Ref Range   Sodium 136 135 - 145 mmol/L   Potassium 3.6 3.5 - 5.1 mmol/L   Chloride 100 (L) 101 - 111 mmol/L   CO2 22 22 - 32 mmol/L   Glucose, Bld 122 (H) 65 - 99 mg/dL   BUN 8 6 - 20 mg/dL   Creatinine, Ser 1.09 0.44 - 1.00 mg/dL   Calcium 9.2 8.9 - 60.4 mg/dL   Total Protein 7.9 6.5 - 8.1 g/dL   Albumin 4.4 3.5 - 5.0 g/dL   AST 24 15 - 41 U/L   ALT 25 14 - 54 U/L  Alkaline Phosphatase 75 38 - 126 U/L   Total Bilirubin 1.7 (H) 0.3 - 1.2 mg/dL   GFR calc non Af Amer >60 >60 mL/min   GFR calc Af Amer >60 >60 mL/min   Anion gap 14 5 - 15  Ethanol  Result Value Ref Range   Alcohol, Ethyl (B) <10 <10 mg/dL  Urine rapid drug screen (hosp performed)  Result Value Ref Range   Opiates NONE DETECTED NONE DETECTED   Cocaine NONE DETECTED NONE DETECTED   Benzodiazepines NONE DETECTED NONE DETECTED   Amphetamines NONE DETECTED NONE DETECTED   Tetrahydrocannabinol POSITIVE (A) NONE DETECTED   Barbiturates NONE DETECTED NONE DETECTED  CBC with Diff  Result Value Ref Range   WBC 15.8 (H) 4.0 - 10.5 K/uL   RBC 4.48 3.87 - 5.11 MIL/uL   Hemoglobin 13.6 12.0 - 15.0 g/dL   HCT 65.739.2 84.636.0 - 96.246.0 %   MCV 87.5 78.0 - 100.0 fL   MCH 30.4 26.0 - 34.0 pg   MCHC 34.7 30.0 - 36.0 g/dL   RDW 95.212.8 84.111.5 - 32.415.5 %   Platelets 339 150 - 400 K/uL   Neutrophils Relative % 76 %   Neutro Abs 12.0 (H) 1.7 - 7.7 K/uL   Lymphocytes Relative 15 %   Lymphs Abs 2.4 0.7 - 4.0 K/uL   Monocytes Relative 9 %   Monocytes Absolute 1.4 (H) 0.1 - 1.0 K/uL   Eosinophils Relative 0 %   Eosinophils Absolute 0.0 0.0 - 0.7 K/uL   Basophils Relative 0 %   Basophils Absolute 0.0 0.0 - 0.1  K/uL  I-Stat beta hCG blood, ED  Result Value Ref Range   I-stat hCG, quantitative <5.0 <5 mIU/mL   Comment 3           No results found.   Radiology No results found.  Procedures Procedures (including critical care time)     Final Clinical Impressions(s) / ED Diagnoses   Medically cleared for psychiatry.     Kalijah Westfall, MD 06/15/17 [phone]

## 2017-06-15 NOTE — ED Notes (Signed)
Pt oriented to room and unit.  Patient is calm at this time.  She is having flight of ideas .  She denies S/I, H/I, and AVH.  Pt appears very confused.  15 minute checks and video monitoring in place.

## 2017-06-15 NOTE — ED Notes (Signed)
No respiratory or acute distress noted alert but confused call light in reach hospital sitter watching pt.

## 2017-06-15 NOTE — Progress Notes (Signed)
CSW received a call from [facility] Number for report is: [phone] Pt's unit/room/bed number will be: Main Campus Room TBD Accepting physician: Dr. Regino SchultzeWang   Pt can arrive after 11 am on 10/26  CSW will update RN and EDP.  Dorothe PeaJonathan F. Abril Cappiello, LCSW, LCAS, CSI Clinical Social Worker Ph: [phone]  '

## 2017-06-15 NOTE — ED Provider Notes (Signed)
12:11 PM Pt is acutely agitated and will benefit from Geodon for patient and staff safety. Under IVC at this time for disorganized thinking and delusional thoughts. Will continue to monitor closely.    Azalia Bilisampos, Cambridge Deleo, MD 06/15/17 1213

## 2017-06-16 NOTE — ED Notes (Signed)
Patient in bathroom singing loudly, yelling and screaming. Observed talking and  responding to internal stimuli. Patient becomes increasingly loud, yelling and cursing unseen people, irritable and using foul words, pacing agitatedly in her room. PRN OF Ativan and Benadryl administered. Patient calm at this time. Will continue to monitor patient.

## 2017-06-16 NOTE — ED Notes (Signed)
Sheriff on unit to transfer pt to [name] per MD order. Personal property given to sheriff for transfer. Pt ambulatory off unit in police custody.

## 2017-06-16 NOTE — ED Provider Notes (Signed)
No complaints. Scheduled for transfer to [facility].   Dione BoozeGlick, Zack Crager, MD 06/16/17 [phone]

## 2017-06-17 LAB — URINE CULTURE: Culture: >=100000 — AB

## 2017-06-18 ENCOUNTER — Telehealth: Payer: Self-pay

## 2017-06-18 NOTE — Telephone Encounter (Signed)
No action needed for UA 06/16/2017 in ED. [facility]

## 2017-06-19 NOTE — ED Notes (Addendum)
Staff from [employer] called on 06/19/17 at 0620 to determine if pt had SANE kit done. Writer advised staff that there was no record of a SANE kit being performed.

## 2017-07-29 IMAGING — CT CT RENAL STONE PROTOCOL
2 of 3 series · 16 of 34 positions shown, 18 images · non-contrast
Comparison: 07/05/2012

CLINICAL DATA: Bilateral flank pain and burning. Nausea and chills.
Pain worse on the left. Urinary frequency and urgency.

EXAM:
CT ABDOMEN AND PELVIS WITHOUT CONTRAST
TECHNIQUE: Multidetector CT imaging of the abdomen and pelvis was performed
following the standard protocol without IV contrast.

[Series 3: lung · axial · 0.69mm/px · z∈[+1475,+1560]mm · 13 of 20 slices shown, 15 images]
[im 2/20  soft-tissue]
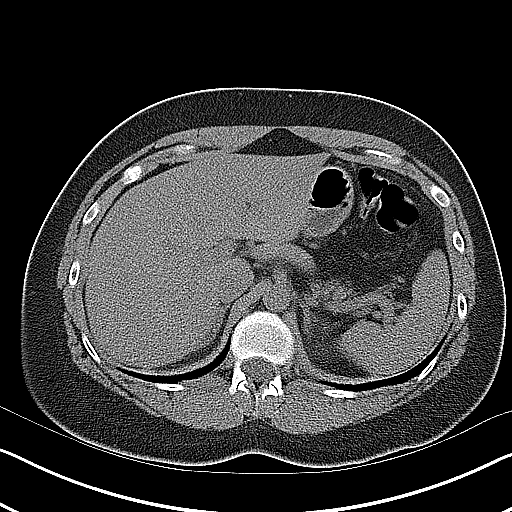
[im 2/20  bone]
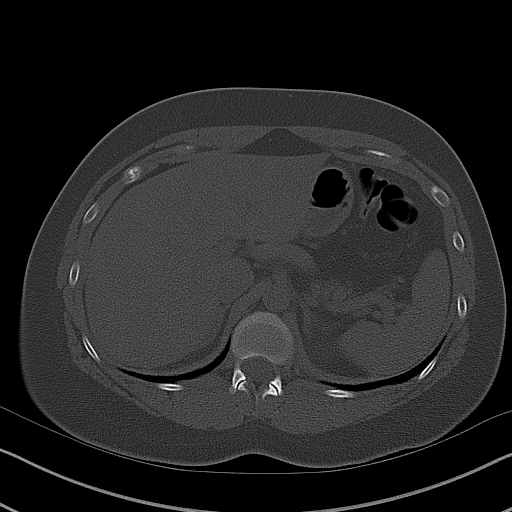
[im 4/20  soft-tissue]
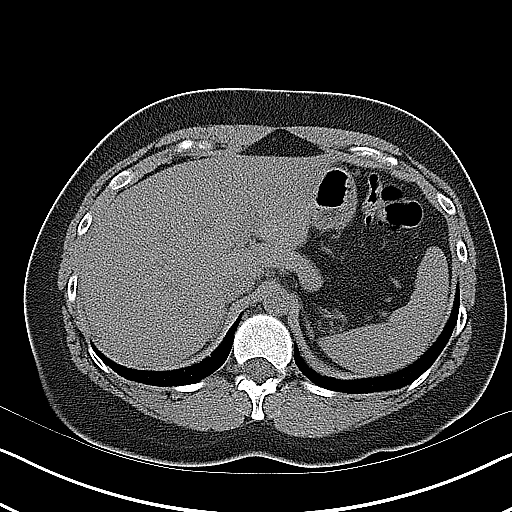
[im 5/20  soft-tissue]
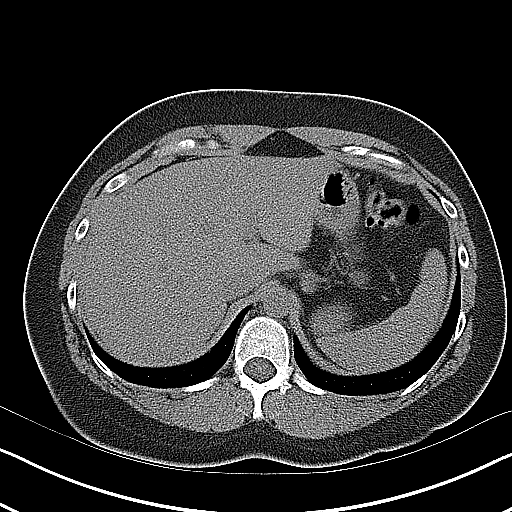
[im 6/20  soft-tissue]
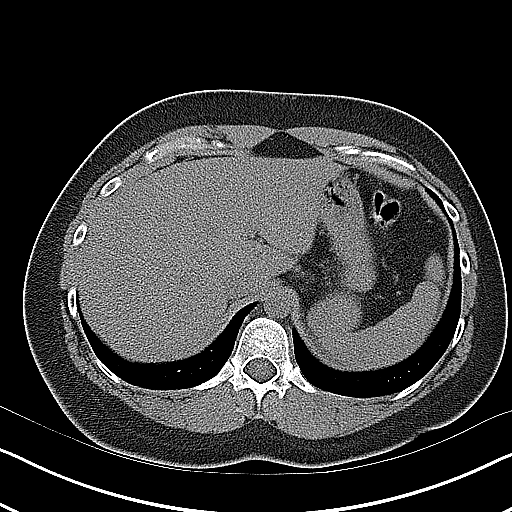
[im 8/20  soft-tissue]
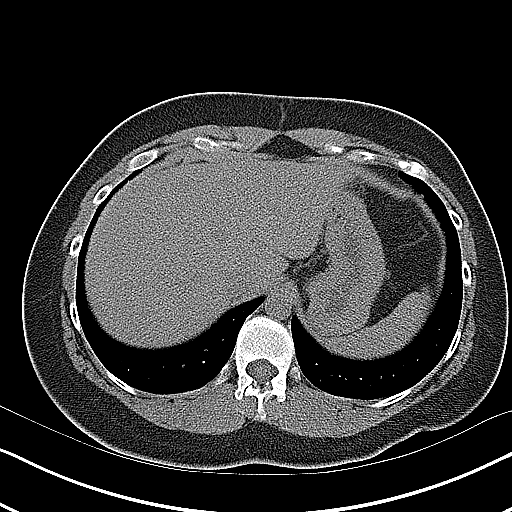
[im 9/20  soft-tissue]
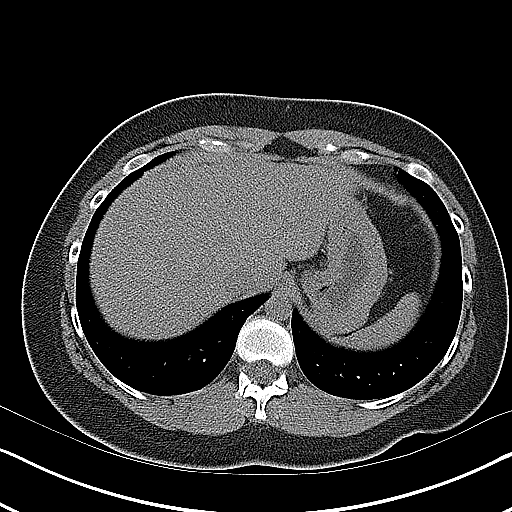
[im 11/20  soft-tissue]
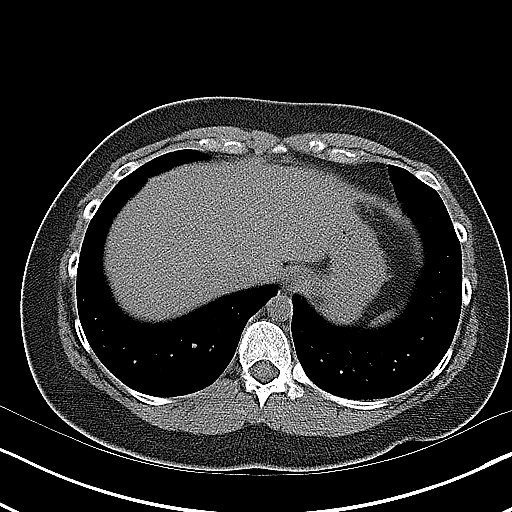
[im 12/20  soft-tissue]
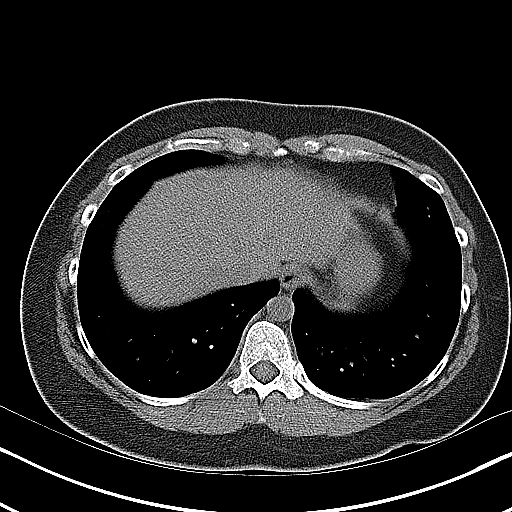
[im 13/20  soft-tissue]
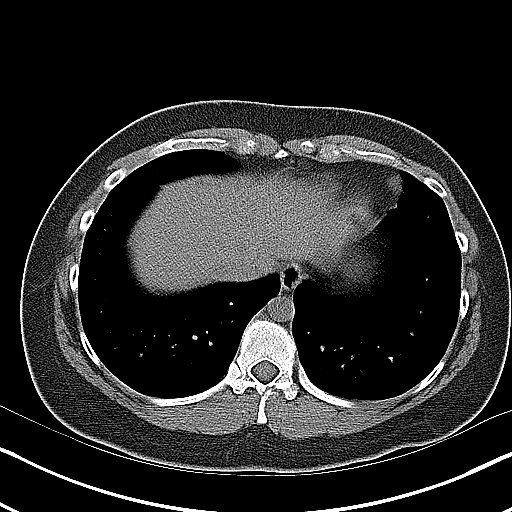
[im 13/20  bone]
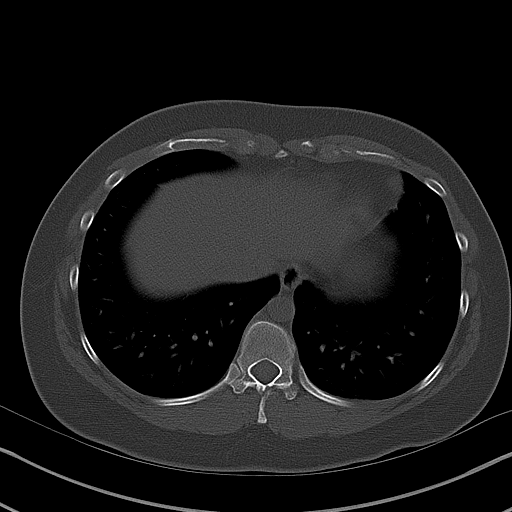
[im 15/20  soft-tissue]
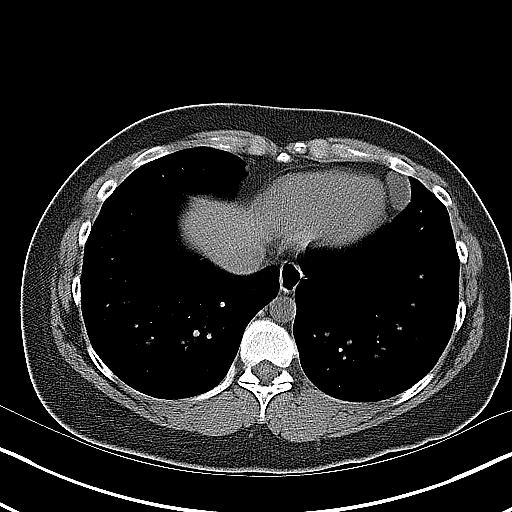
[im 16/20  soft-tissue]
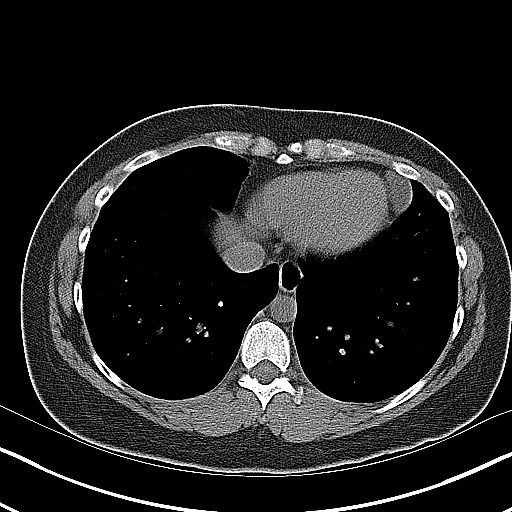
[im 17/20  soft-tissue]
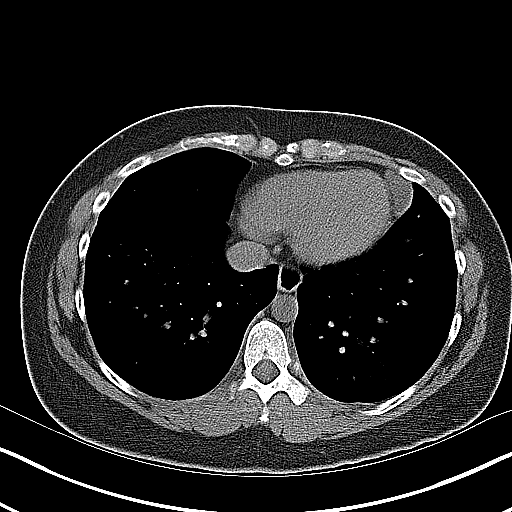
[im 19/20  soft-tissue]
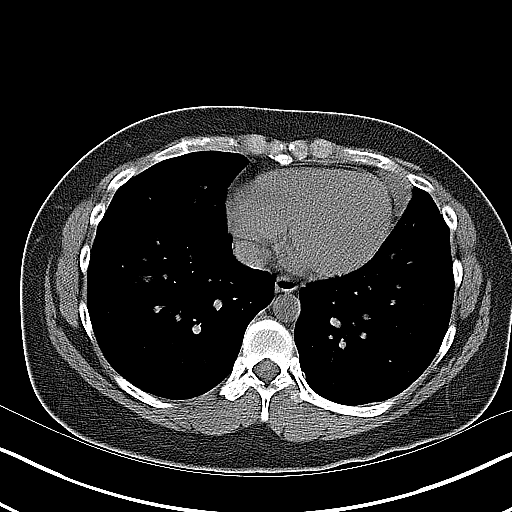

[Series 4: coronal · coronal · 0.67mm/px · 3 of 82 slices shown]
[im 28/82  soft-tissue]
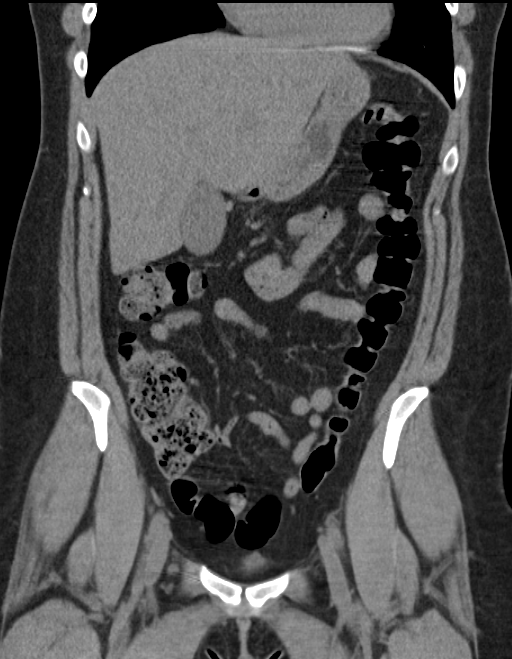
[im 37/82  soft-tissue]
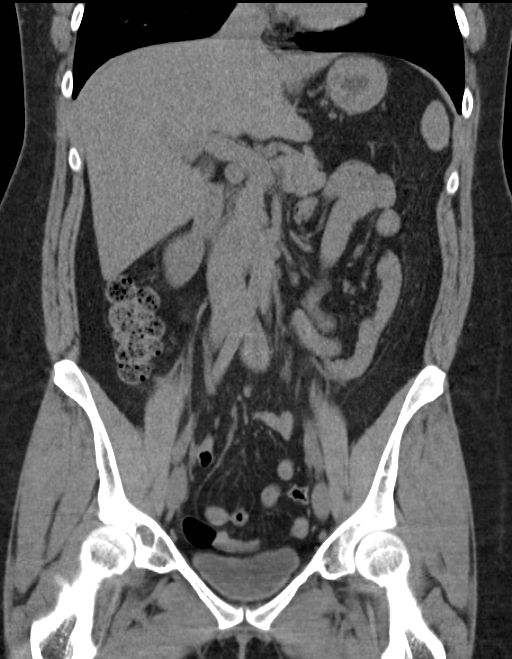
[im 46/82  soft-tissue]
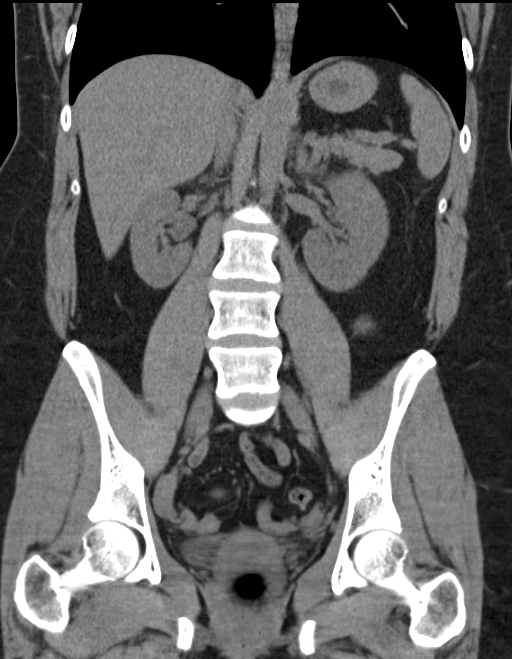

[16 of 34 positions shown; findings below may reference images not displayed]

FINDINGS: Lung bases are clear. Circumscribed low-attenuation lesion in the
left cardiophrenic angle likely represents a small pericardial cyst.
The lesion is slightly larger than on the previous study.

Kidneys are symmetrical in size and shape. No hydronephrosis or
hydroureter. No renal, ureteral, or bladder stones. No bladder wall
thickening.

The unenhanced appearance of the liver, spleen, gallbladder,
pancreas, adrenal glands, abdominal aorta, inferior vena cava, and
retroperitoneal lymph nodes is unremarkable.

The stomach and small bowel are decompressed. Scattered stool in the
colon. No colonic distention. No free air or free fluid in the
abdomen. Small umbilical hernia containing fat.

Pelvis: The appendix is normal. Uterus and ovaries are not enlarged.
No free or loculated pelvic fluid collections. No pelvic mass or
lymphadenopathy. No destructive bone lesions.
IMPRESSION: No renal or ureteral stone or obstruction.

## 2017-10-14 IMAGING — US US TRANSVAGINAL NON-OB
1 series · 13 of 25 positions shown · non-contrast
Comparison: CT 10/28/2015

CLINICAL DATA: Left adnexal pain for 2 weeks.  Evaluate for torsion

EXAM:
TRANSABDOMINAL AND TRANSVAGINAL ULTRASOUND OF PELVIS
DOPPLER ULTRASOUND OF OVARIES
TECHNIQUE: Both transabdominal and transvaginal ultrasound examinations of the
pelvis were performed. Transabdominal technique was performed for
global imaging of the pelvis including uterus, ovaries, adnexal
regions, and pelvic cul-de-sac.
It was necessary to proceed with endovaginal exam following the
transabdominal exam to visualize the ovaries. Color and duplex
Doppler ultrasound was utilized to evaluate blood flow to the
ovaries.

[Series 1: us transvaginal non-ob · 0.24mm/px · 13 of 139 slices shown]
[im 1/139]
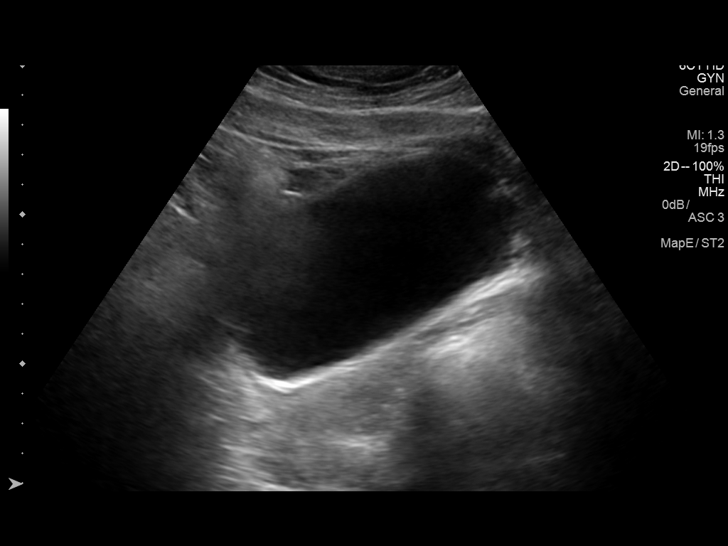
[im 12/139]
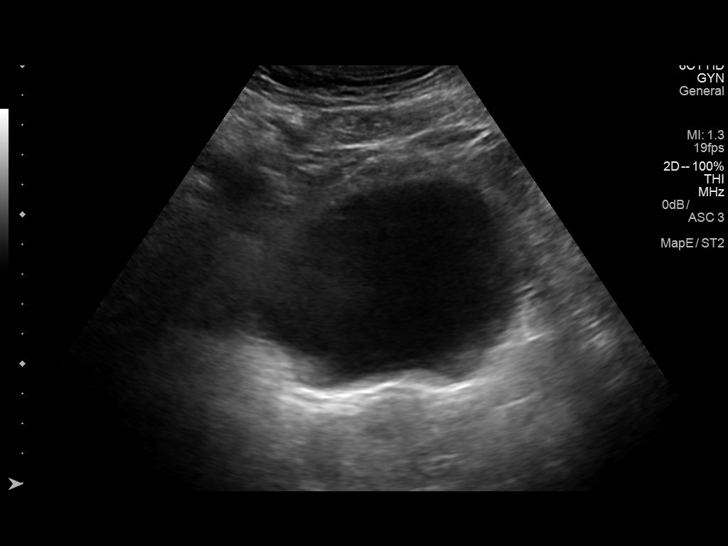
[im 24/139]
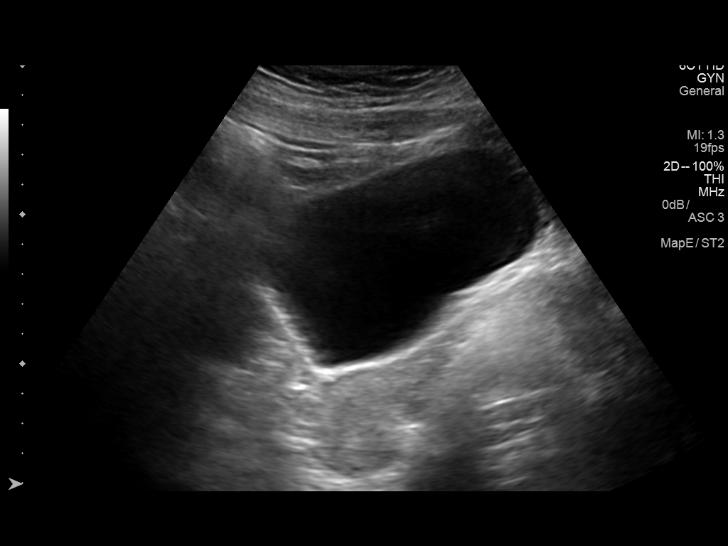
[im 35/139]
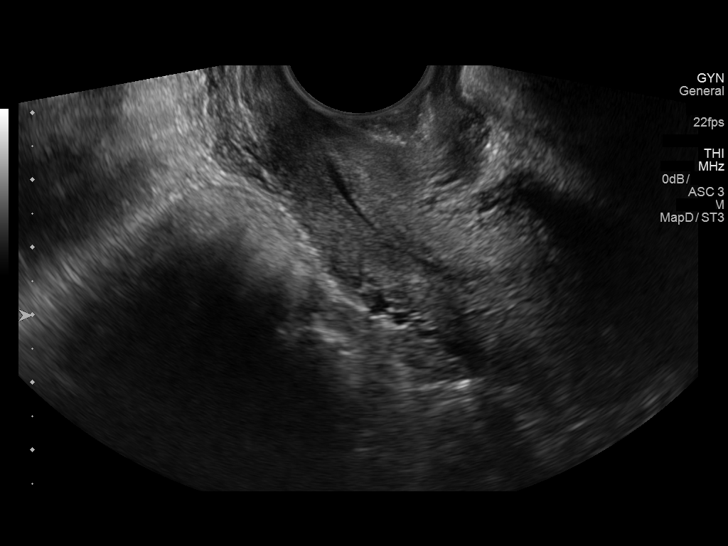
[im 47/139]
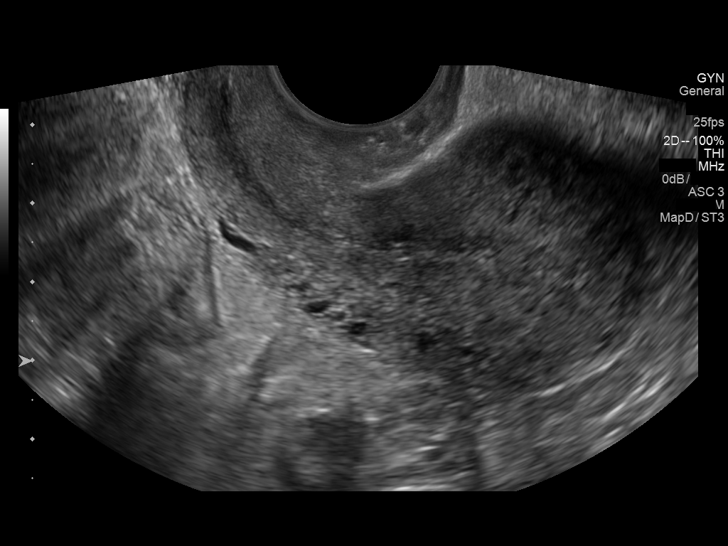
[im 58/139]
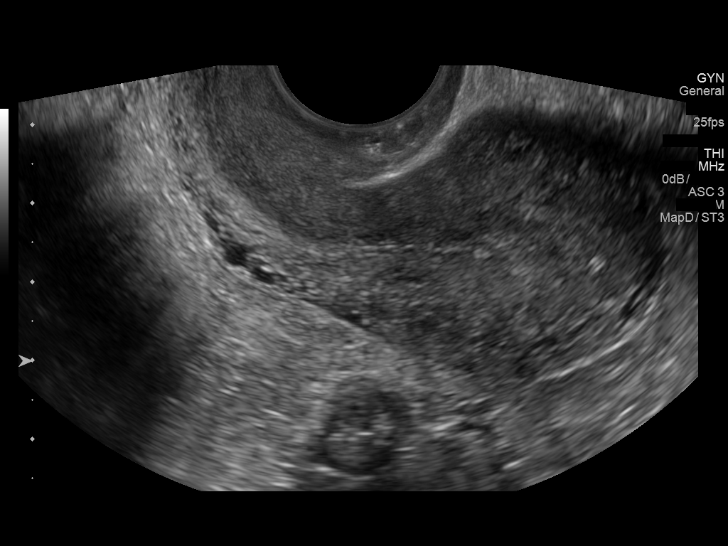
[im 70/139]
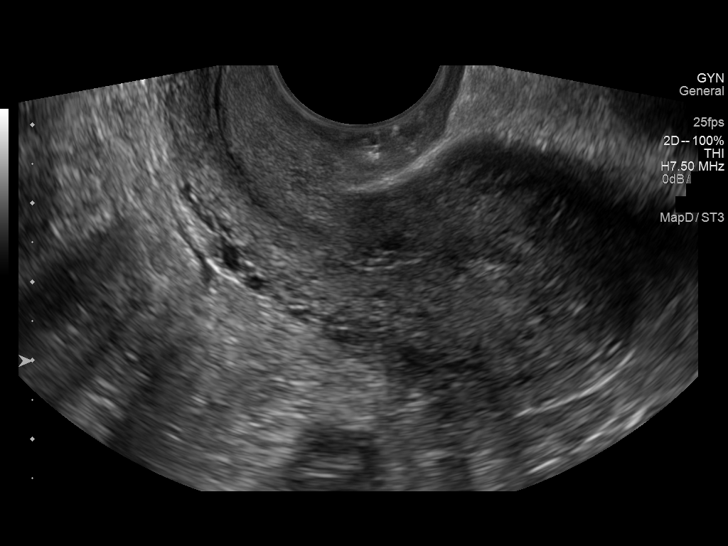
[im 81/139]
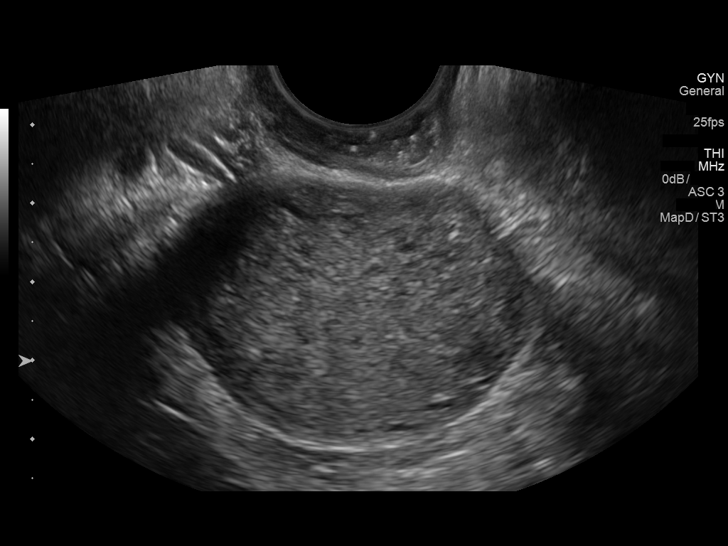
[im 93/139]
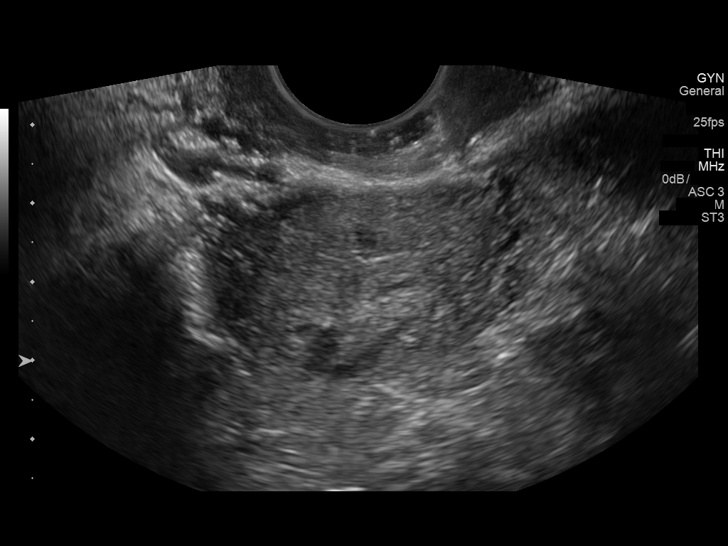
[im 104/139]
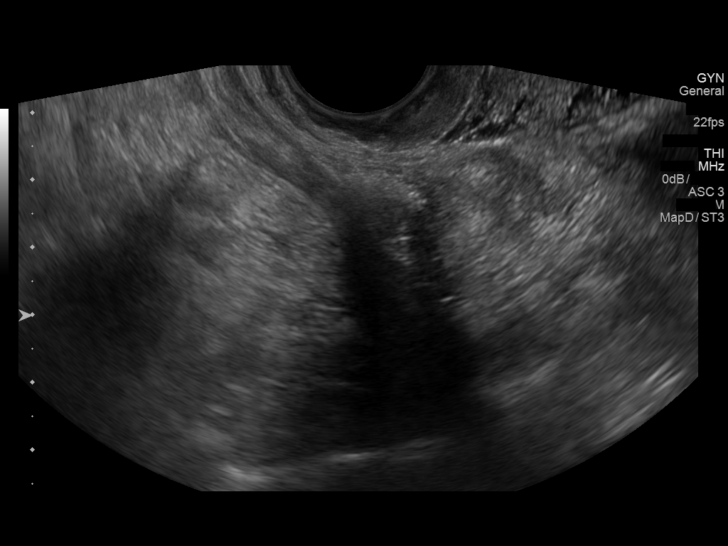
[im 116/139]
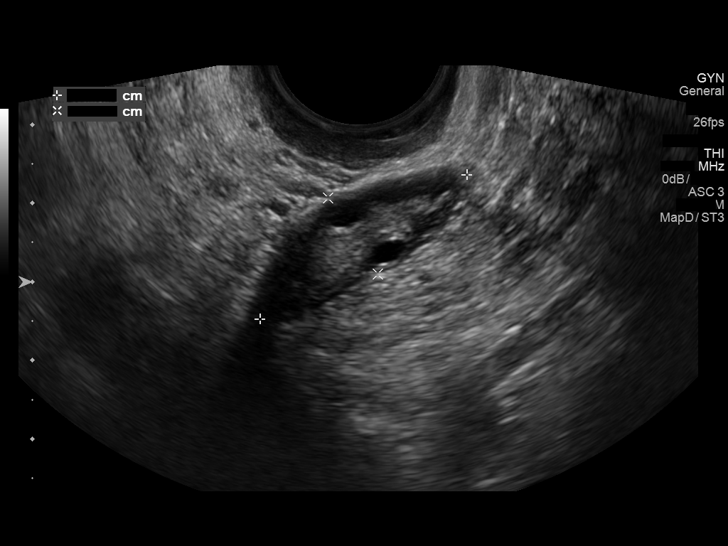
[im 127/139]
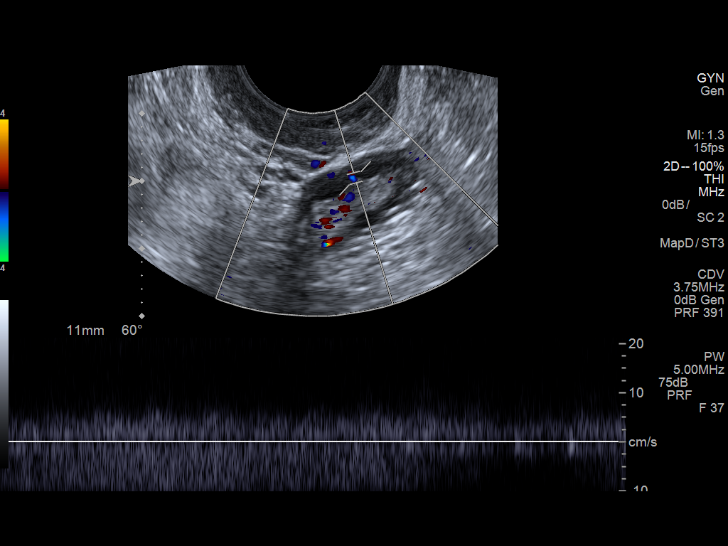
[im 139/139]
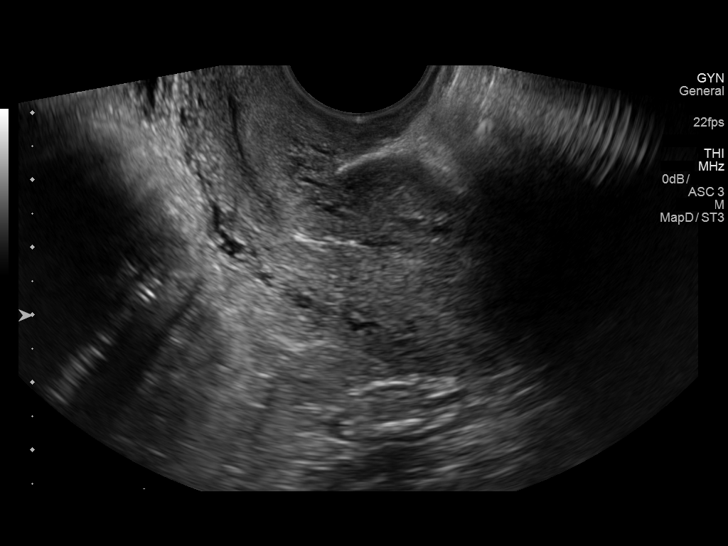

[13 of 25 positions shown; findings below may reference images not displayed]

FINDINGS: Uterus

Measurements: 7 x 3 x 4 cm. No fibroids or other mass visualized.

Endometrium

Thickness: 5 mm.  No focal abnormality visualized.

Right ovary

Not visible

Left ovary

Measurements: 32 x 12 x 16 mm. Normal appearance/no adnexal mass.

Pulsed Doppler evaluation of visible left ovary demonstrates normal
low-resistance arterial and venous waveforms.

Other findings

No abnormal free fluid.
IMPRESSION: 1. On the symptomatic left side the ovary is normal. No explanation
for left pelvic pain.
2. Normal uterus.
3. Right ovary not seen.

## 2018-03-30 DIAGNOSIS — J3089 Other allergic rhinitis: Secondary | ICD-10-CM | POA: Diagnosis not present

## 2018-03-30 DIAGNOSIS — J301 Allergic rhinitis due to pollen: Secondary | ICD-10-CM | POA: Diagnosis not present

## 2018-03-30 DIAGNOSIS — J3081 Allergic rhinitis due to animal (cat) (dog) hair and dander: Secondary | ICD-10-CM | POA: Diagnosis not present

## 2018-04-03 DIAGNOSIS — J3081 Allergic rhinitis due to animal (cat) (dog) hair and dander: Secondary | ICD-10-CM | POA: Diagnosis not present

## 2018-04-03 DIAGNOSIS — J3089 Other allergic rhinitis: Secondary | ICD-10-CM | POA: Diagnosis not present

## 2018-04-03 DIAGNOSIS — R1030 Lower abdominal pain, unspecified: Secondary | ICD-10-CM | POA: Diagnosis not present

## 2018-04-03 DIAGNOSIS — M62838 Other muscle spasm: Secondary | ICD-10-CM | POA: Diagnosis not present

## 2018-04-03 DIAGNOSIS — J301 Allergic rhinitis due to pollen: Secondary | ICD-10-CM | POA: Diagnosis not present

## 2018-04-03 DIAGNOSIS — M6281 Muscle weakness (generalized): Secondary | ICD-10-CM | POA: Diagnosis not present

## 2018-04-03 DIAGNOSIS — R102 Pelvic and perineal pain: Secondary | ICD-10-CM | POA: Diagnosis not present

## 2018-04-13 DIAGNOSIS — J3081 Allergic rhinitis due to animal (cat) (dog) hair and dander: Secondary | ICD-10-CM | POA: Diagnosis not present

## 2018-04-13 DIAGNOSIS — J301 Allergic rhinitis due to pollen: Secondary | ICD-10-CM | POA: Diagnosis not present

## 2018-04-13 DIAGNOSIS — J3089 Other allergic rhinitis: Secondary | ICD-10-CM | POA: Diagnosis not present

## 2018-04-18 DIAGNOSIS — N925 Other specified irregular menstruation: Secondary | ICD-10-CM | POA: Diagnosis not present

## 2018-04-18 DIAGNOSIS — Z3161 Procreative counseling and advice using natural family planning: Secondary | ICD-10-CM | POA: Diagnosis not present

## 2018-04-20 DIAGNOSIS — D2261 Melanocytic nevi of right upper limb, including shoulder: Secondary | ICD-10-CM | POA: Diagnosis not present

## 2018-04-20 DIAGNOSIS — J3081 Allergic rhinitis due to animal (cat) (dog) hair and dander: Secondary | ICD-10-CM | POA: Diagnosis not present

## 2018-04-20 DIAGNOSIS — L814 Other melanin hyperpigmentation: Secondary | ICD-10-CM | POA: Diagnosis not present

## 2018-04-20 DIAGNOSIS — J3089 Other allergic rhinitis: Secondary | ICD-10-CM | POA: Diagnosis not present

## 2018-04-20 DIAGNOSIS — D2262 Melanocytic nevi of left upper limb, including shoulder: Secondary | ICD-10-CM | POA: Diagnosis not present

## 2018-04-20 DIAGNOSIS — D2271 Melanocytic nevi of right lower limb, including hip: Secondary | ICD-10-CM | POA: Diagnosis not present

## 2018-04-20 DIAGNOSIS — J301 Allergic rhinitis due to pollen: Secondary | ICD-10-CM | POA: Diagnosis not present

## 2018-04-25 DIAGNOSIS — J3081 Allergic rhinitis due to animal (cat) (dog) hair and dander: Secondary | ICD-10-CM | POA: Diagnosis not present

## 2018-04-25 DIAGNOSIS — J3089 Other allergic rhinitis: Secondary | ICD-10-CM | POA: Diagnosis not present

## 2018-04-25 DIAGNOSIS — J301 Allergic rhinitis due to pollen: Secondary | ICD-10-CM | POA: Diagnosis not present

## 2018-05-04 DIAGNOSIS — J301 Allergic rhinitis due to pollen: Secondary | ICD-10-CM | POA: Diagnosis not present

## 2018-05-04 DIAGNOSIS — J3081 Allergic rhinitis due to animal (cat) (dog) hair and dander: Secondary | ICD-10-CM | POA: Diagnosis not present

## 2018-05-04 DIAGNOSIS — J3089 Other allergic rhinitis: Secondary | ICD-10-CM | POA: Diagnosis not present

## 2018-05-10 DIAGNOSIS — J3081 Allergic rhinitis due to animal (cat) (dog) hair and dander: Secondary | ICD-10-CM | POA: Diagnosis not present

## 2018-05-10 DIAGNOSIS — J3089 Other allergic rhinitis: Secondary | ICD-10-CM | POA: Diagnosis not present

## 2018-05-10 DIAGNOSIS — J301 Allergic rhinitis due to pollen: Secondary | ICD-10-CM | POA: Diagnosis not present

## 2018-05-11 DIAGNOSIS — J3089 Other allergic rhinitis: Secondary | ICD-10-CM | POA: Diagnosis not present

## 2018-05-11 DIAGNOSIS — J301 Allergic rhinitis due to pollen: Secondary | ICD-10-CM | POA: Diagnosis not present

## 2018-05-11 DIAGNOSIS — J3081 Allergic rhinitis due to animal (cat) (dog) hair and dander: Secondary | ICD-10-CM | POA: Diagnosis not present

## 2018-05-17 DIAGNOSIS — J301 Allergic rhinitis due to pollen: Secondary | ICD-10-CM | POA: Diagnosis not present

## 2018-05-17 DIAGNOSIS — J3089 Other allergic rhinitis: Secondary | ICD-10-CM | POA: Diagnosis not present

## 2018-05-17 DIAGNOSIS — J3081 Allergic rhinitis due to animal (cat) (dog) hair and dander: Secondary | ICD-10-CM | POA: Diagnosis not present

## 2018-05-24 DIAGNOSIS — J301 Allergic rhinitis due to pollen: Secondary | ICD-10-CM | POA: Diagnosis not present

## 2018-05-24 DIAGNOSIS — J3089 Other allergic rhinitis: Secondary | ICD-10-CM | POA: Diagnosis not present

## 2018-05-24 DIAGNOSIS — J3081 Allergic rhinitis due to animal (cat) (dog) hair and dander: Secondary | ICD-10-CM | POA: Diagnosis not present

## 2018-06-01 DIAGNOSIS — J3081 Allergic rhinitis due to animal (cat) (dog) hair and dander: Secondary | ICD-10-CM | POA: Diagnosis not present

## 2018-06-01 DIAGNOSIS — J301 Allergic rhinitis due to pollen: Secondary | ICD-10-CM | POA: Diagnosis not present

## 2018-06-01 DIAGNOSIS — J3089 Other allergic rhinitis: Secondary | ICD-10-CM | POA: Diagnosis not present

## 2018-06-07 DIAGNOSIS — J3081 Allergic rhinitis due to animal (cat) (dog) hair and dander: Secondary | ICD-10-CM | POA: Diagnosis not present

## 2018-06-07 DIAGNOSIS — J301 Allergic rhinitis due to pollen: Secondary | ICD-10-CM | POA: Diagnosis not present

## 2018-06-07 DIAGNOSIS — M6281 Muscle weakness (generalized): Secondary | ICD-10-CM | POA: Diagnosis not present

## 2018-06-07 DIAGNOSIS — R350 Frequency of micturition: Secondary | ICD-10-CM | POA: Diagnosis not present

## 2018-06-07 DIAGNOSIS — R102 Pelvic and perineal pain: Secondary | ICD-10-CM | POA: Diagnosis not present

## 2018-06-07 DIAGNOSIS — M62838 Other muscle spasm: Secondary | ICD-10-CM | POA: Diagnosis not present

## 2018-06-07 DIAGNOSIS — J3089 Other allergic rhinitis: Secondary | ICD-10-CM | POA: Diagnosis not present

## 2018-06-13 DIAGNOSIS — J3089 Other allergic rhinitis: Secondary | ICD-10-CM | POA: Diagnosis not present

## 2018-06-13 DIAGNOSIS — J301 Allergic rhinitis due to pollen: Secondary | ICD-10-CM | POA: Diagnosis not present

## 2018-06-13 DIAGNOSIS — H1045 Other chronic allergic conjunctivitis: Secondary | ICD-10-CM | POA: Diagnosis not present

## 2018-06-13 DIAGNOSIS — J4530 Mild persistent asthma, uncomplicated: Secondary | ICD-10-CM | POA: Diagnosis not present

## 2018-06-15 DIAGNOSIS — J301 Allergic rhinitis due to pollen: Secondary | ICD-10-CM | POA: Diagnosis not present

## 2018-06-15 DIAGNOSIS — J3081 Allergic rhinitis due to animal (cat) (dog) hair and dander: Secondary | ICD-10-CM | POA: Diagnosis not present

## 2018-06-15 DIAGNOSIS — J3089 Other allergic rhinitis: Secondary | ICD-10-CM | POA: Diagnosis not present

## 2018-06-22 DIAGNOSIS — J3081 Allergic rhinitis due to animal (cat) (dog) hair and dander: Secondary | ICD-10-CM | POA: Diagnosis not present

## 2018-06-22 DIAGNOSIS — J3089 Other allergic rhinitis: Secondary | ICD-10-CM | POA: Diagnosis not present

## 2018-06-22 DIAGNOSIS — J301 Allergic rhinitis due to pollen: Secondary | ICD-10-CM | POA: Diagnosis not present

## 2018-07-06 DIAGNOSIS — J3089 Other allergic rhinitis: Secondary | ICD-10-CM | POA: Diagnosis not present

## 2018-07-06 DIAGNOSIS — J301 Allergic rhinitis due to pollen: Secondary | ICD-10-CM | POA: Diagnosis not present

## 2018-07-06 DIAGNOSIS — J3081 Allergic rhinitis due to animal (cat) (dog) hair and dander: Secondary | ICD-10-CM | POA: Diagnosis not present

## 2018-07-13 DIAGNOSIS — J3089 Other allergic rhinitis: Secondary | ICD-10-CM | POA: Diagnosis not present

## 2018-07-13 DIAGNOSIS — J301 Allergic rhinitis due to pollen: Secondary | ICD-10-CM | POA: Diagnosis not present

## 2018-07-13 DIAGNOSIS — J3081 Allergic rhinitis due to animal (cat) (dog) hair and dander: Secondary | ICD-10-CM | POA: Diagnosis not present

## 2018-07-16 DIAGNOSIS — J3089 Other allergic rhinitis: Secondary | ICD-10-CM | POA: Diagnosis not present

## 2018-07-16 DIAGNOSIS — J3081 Allergic rhinitis due to animal (cat) (dog) hair and dander: Secondary | ICD-10-CM | POA: Diagnosis not present

## 2018-07-16 DIAGNOSIS — J301 Allergic rhinitis due to pollen: Secondary | ICD-10-CM | POA: Diagnosis not present

## 2018-07-27 DIAGNOSIS — J3089 Other allergic rhinitis: Secondary | ICD-10-CM | POA: Diagnosis not present

## 2018-07-27 DIAGNOSIS — J301 Allergic rhinitis due to pollen: Secondary | ICD-10-CM | POA: Diagnosis not present

## 2018-07-27 DIAGNOSIS — J3081 Allergic rhinitis due to animal (cat) (dog) hair and dander: Secondary | ICD-10-CM | POA: Diagnosis not present

## 2018-08-06 DIAGNOSIS — Z3201 Encounter for pregnancy test, result positive: Secondary | ICD-10-CM | POA: Diagnosis not present

## 2018-08-08 DIAGNOSIS — Z3201 Encounter for pregnancy test, result positive: Secondary | ICD-10-CM | POA: Diagnosis not present

## 2018-08-10 DIAGNOSIS — J301 Allergic rhinitis due to pollen: Secondary | ICD-10-CM | POA: Diagnosis not present

## 2018-08-10 DIAGNOSIS — J3089 Other allergic rhinitis: Secondary | ICD-10-CM | POA: Diagnosis not present

## 2018-08-10 DIAGNOSIS — J3081 Allergic rhinitis due to animal (cat) (dog) hair and dander: Secondary | ICD-10-CM | POA: Diagnosis not present

## 2018-08-13 DIAGNOSIS — O200 Threatened abortion: Secondary | ICD-10-CM | POA: Diagnosis not present

## 2018-08-13 DIAGNOSIS — I10 Essential (primary) hypertension: Secondary | ICD-10-CM | POA: Diagnosis not present

## 2018-08-17 DIAGNOSIS — J3089 Other allergic rhinitis: Secondary | ICD-10-CM | POA: Diagnosis not present

## 2018-08-17 DIAGNOSIS — J3081 Allergic rhinitis due to animal (cat) (dog) hair and dander: Secondary | ICD-10-CM | POA: Diagnosis not present

## 2018-08-17 DIAGNOSIS — J301 Allergic rhinitis due to pollen: Secondary | ICD-10-CM | POA: Diagnosis not present

## 2018-08-24 DIAGNOSIS — J3089 Other allergic rhinitis: Secondary | ICD-10-CM | POA: Diagnosis not present

## 2018-08-24 DIAGNOSIS — J301 Allergic rhinitis due to pollen: Secondary | ICD-10-CM | POA: Diagnosis not present

## 2018-08-24 DIAGNOSIS — J3081 Allergic rhinitis due to animal (cat) (dog) hair and dander: Secondary | ICD-10-CM | POA: Diagnosis not present

## 2018-08-30 DIAGNOSIS — Z3401 Encounter for supervision of normal first pregnancy, first trimester: Secondary | ICD-10-CM | POA: Diagnosis not present

## 2018-08-30 DIAGNOSIS — I10 Essential (primary) hypertension: Secondary | ICD-10-CM | POA: Diagnosis not present

## 2018-08-30 DIAGNOSIS — Z1332 Encounter for screening for maternal depression: Secondary | ICD-10-CM | POA: Diagnosis not present

## 2018-08-30 DIAGNOSIS — Z3201 Encounter for pregnancy test, result positive: Secondary | ICD-10-CM | POA: Diagnosis not present

## 2018-08-31 DIAGNOSIS — J301 Allergic rhinitis due to pollen: Secondary | ICD-10-CM | POA: Diagnosis not present

## 2018-08-31 DIAGNOSIS — J3089 Other allergic rhinitis: Secondary | ICD-10-CM | POA: Diagnosis not present

## 2018-08-31 DIAGNOSIS — J3081 Allergic rhinitis due to animal (cat) (dog) hair and dander: Secondary | ICD-10-CM | POA: Diagnosis not present

## 2018-09-07 DIAGNOSIS — J3089 Other allergic rhinitis: Secondary | ICD-10-CM | POA: Diagnosis not present

## 2018-09-07 DIAGNOSIS — J3081 Allergic rhinitis due to animal (cat) (dog) hair and dander: Secondary | ICD-10-CM | POA: Diagnosis not present

## 2018-09-07 DIAGNOSIS — J301 Allergic rhinitis due to pollen: Secondary | ICD-10-CM | POA: Diagnosis not present

## 2018-09-13 DIAGNOSIS — J301 Allergic rhinitis due to pollen: Secondary | ICD-10-CM | POA: Diagnosis not present

## 2018-09-14 DIAGNOSIS — J3081 Allergic rhinitis due to animal (cat) (dog) hair and dander: Secondary | ICD-10-CM | POA: Diagnosis not present

## 2018-09-14 DIAGNOSIS — J301 Allergic rhinitis due to pollen: Secondary | ICD-10-CM | POA: Diagnosis not present

## 2018-09-14 DIAGNOSIS — J3089 Other allergic rhinitis: Secondary | ICD-10-CM | POA: Diagnosis not present

## 2018-09-21 DIAGNOSIS — J3089 Other allergic rhinitis: Secondary | ICD-10-CM | POA: Diagnosis not present

## 2018-09-21 DIAGNOSIS — J301 Allergic rhinitis due to pollen: Secondary | ICD-10-CM | POA: Diagnosis not present

## 2018-09-21 DIAGNOSIS — J3081 Allergic rhinitis due to animal (cat) (dog) hair and dander: Secondary | ICD-10-CM | POA: Diagnosis not present

## 2018-09-26 DIAGNOSIS — Z3401 Encounter for supervision of normal first pregnancy, first trimester: Secondary | ICD-10-CM | POA: Diagnosis not present

## 2018-09-26 DIAGNOSIS — I10 Essential (primary) hypertension: Secondary | ICD-10-CM | POA: Diagnosis not present

## 2018-10-09 ENCOUNTER — Emergency Department (HOSPITAL_COMMUNITY)
Admission: EM | Admit: 2018-10-09 | Discharge: 2018-10-09 | Disposition: A | Discharge status: Home / Self Care | Payer: BLUE CROSS/BLUE SHIELD | Attending: Emergency Medicine | Admitting: Emergency Medicine

## 2018-10-09 ENCOUNTER — Other Ambulatory Visit: Payer: Self-pay

## 2018-10-09 ENCOUNTER — Encounter (HOSPITAL_COMMUNITY): Payer: Self-pay | Admitting: Emergency Medicine

## 2018-10-09 DIAGNOSIS — O9989 Other specified diseases and conditions complicating pregnancy, childbirth and the puerperium: Secondary | ICD-10-CM | POA: Diagnosis not present

## 2018-10-09 DIAGNOSIS — R339 Retention of urine, unspecified: Secondary | ICD-10-CM | POA: Insufficient documentation

## 2018-10-09 DIAGNOSIS — J301 Allergic rhinitis due to pollen: Secondary | ICD-10-CM | POA: Diagnosis not present

## 2018-10-09 DIAGNOSIS — Z79899 Other long term (current) drug therapy: Secondary | ICD-10-CM | POA: Insufficient documentation

## 2018-10-09 DIAGNOSIS — J3081 Allergic rhinitis due to animal (cat) (dog) hair and dander: Secondary | ICD-10-CM | POA: Diagnosis not present

## 2018-10-09 DIAGNOSIS — Z3A14 14 weeks gestation of pregnancy: Secondary | ICD-10-CM | POA: Insufficient documentation

## 2018-10-09 DIAGNOSIS — M6281 Muscle weakness (generalized): Secondary | ICD-10-CM | POA: Diagnosis not present

## 2018-10-09 DIAGNOSIS — M62838 Other muscle spasm: Secondary | ICD-10-CM | POA: Diagnosis not present

## 2018-10-09 DIAGNOSIS — J3089 Other allergic rhinitis: Secondary | ICD-10-CM | POA: Diagnosis not present

## 2018-10-09 DIAGNOSIS — R3912 Poor urinary stream: Secondary | ICD-10-CM | POA: Diagnosis not present

## 2018-10-09 DIAGNOSIS — N3010 Interstitial cystitis (chronic) without hematuria: Secondary | ICD-10-CM | POA: Diagnosis not present

## 2018-10-09 DIAGNOSIS — K5900 Constipation, unspecified: Secondary | ICD-10-CM | POA: Diagnosis not present

## 2018-10-09 LAB — URINALYSIS, ROUTINE W REFLEX MICROSCOPIC
Bilirubin Urine: NEGATIVE
GLUCOSE, UA: NEGATIVE mg/dL
Ketones, ur: NEGATIVE mg/dL
NITRITE: NEGATIVE
PH: 7 (ref 5.0–8.0)
PROTEIN: NEGATIVE mg/dL
SPECIFIC GRAVITY, URINE: 1.004 — AB (ref 1.005–1.030)

## 2018-10-09 MED ORDER — HYDROMORPHONE HCL 1 MG/ML IJ SOLN
0.5000 mg | Freq: Once | INTRAMUSCULAR | Status: AC
  Administered 2018-10-09: 0.5 mg via INTRAMUSCULAR
  Filled 2018-10-09: qty 1

## 2018-10-09 NOTE — ED Notes (Signed)
Called [facility] @2008  for RN [employer]

## 2018-10-09 NOTE — ED Triage Notes (Signed)
Patient c/o urinary retention today. Reports last urinating at 1500. States bright red vaginal bleeding since 1700. Reports she is [redacted] weeks pregnant.

## 2018-10-09 NOTE — ED Notes (Signed)
Patient made aware to call out when ready to void. Call bed placed at bedside. Husband at bedside.

## 2018-10-09 NOTE — Discharge Instructions (Addendum)
Call your urologist in the morning to discuss the urinary retention, and requirement for catheterization.  He can help you with the planning for catheter removal.  If you continue to have vaginal bleeding, contact your obstetrician for further care and management.  You can always return here if needed for problems.

## 2018-10-09 NOTE — ED Provider Notes (Signed)
[facility]-EMERGENCY DEPT Provider Note   CSN: [id number] Arrival date & time: 10/09/18  1802    History   Chief Complaint Chief Complaint  Patient presents with  . Urinary Retention    HPI Sherry Fuentes is a 33 y.o. female.     HPI   She reports onset of difficulty voiding, today.  She felt that it was secondary to her prior pelvic floor dysfunction so she attempted to release the pelvic floor muscles by inserting her fingers in her vagina.  When she withdrew her finger she noticed some bright red blood on them.  She states she is [redacted] weeks pregnant, and it is currently uncomplicated.  She denies fever, chills, nausea, vomiting, pre-existing dysuria or urinary frequency.  She states that she has interstitial cystitis.  There are no other known modifying factors.  Past Medical History:  Diagnosis Date  . Asthma   . Frequency of urination   . GERD (gastroesophageal reflux disease)   . Interstitial cystitis   . Nocturia   . Pelvic pain in female   . Urgency of urination     Patient Active Problem List   Diagnosis Date Noted  . Cannabis use, unspecified with psychotic disorder with delusions (HCC) 06/15/2017    Past Surgical History:  Procedure Laterality Date  . CYSTO WITH HYDRODISTENSION  08/02/2012   Procedure: CYSTOSCOPY/HYDRODISTENSION;  Surgeon: Martina Sinner, MD;  Location: [facility];  Service: Urology;  Laterality: N/A;  Cystoscopy/Hydrodistension Instillation of Marcaine and Pyridum  . LAPAROSCOPY N/A 11/16/2015   Procedure: LAPAROSCOPY DIAGNOSTIC; LYSIS OF ADHESIONS;  Surgeon: Richarda Overlie, MD;  Location: [facility];  Service: Gynecology;  Laterality: N/A;  . NEGATIVE SLEEP STUDY  2015  . REMOVAL RIGHT WRIST CYST  2011  . TONSILLECTOMY  2015  . WISDOM TOOTH EXTRACTION  AGE 89     OB History    Gravida  1   Para      Term      Preterm      AB      Living        SAB      TAB      Ectopic      Multiple      Live Births               Home Medications    Prior to Admission medications   Medication Sig Start Date End Date Taking? Authorizing Provider  BREO ELLIPTA 100-25 MCG/INH AEPB Inhale 1 puff into the lungs daily.  05/29/17   [provider]  Cholecalciferol (VITAMIN D-3 PO) Take 1,000 Units by mouth daily.     [provider]  cyclobenzaprine (FLEXERIL) 10 MG tablet Take 10 mg by mouth 3 (three) times daily as needed for muscle spasms.    [provider]  Fluticasone-Salmeterol (ADVAIR DISKUS) 100-50 MCG/DOSE AEPB Inhale 1 puff into the lungs 2 (two) times daily.    [provider]  IODINE, KELP, PO Take 232 mcg by mouth daily.    [provider]  Omega 3 1000 MG CAPS Take 1,000 mg by mouth daily.    [provider]  Prenatal Vit-Fe Fumarate-FA (PRENATAL MULTIVITAMIN) TABS tablet Take 3 tablets by mouth daily at 12 noon.    [provider]  PROAIR HFA 108 [phone] Base) MCG/ACT inhaler Inhale 2 puffs into the lungs every 6 (six) hours as needed for wheezing or shortness of breath.  10/07/15  [provider]  vitamin B-12 (CYANOCOBALAMIN) 1000 MCG tablet Take 1,000 mcg by mouth every 3 (three) days.     [provider]    Family History No family history on file.  Social History Social History   Tobacco Use  . Smoking status: Never Smoker  . Smokeless tobacco: Never Used  Substance Use Topics  . Alcohol use: Yes    Comment: RARE  . Drug use: No     Allergies   Patient has no active allergies.   Review of Systems Review of Systems  All other systems reviewed and are negative.    Physical Exam Updated Vital Signs BP 121/70 (BP Location: Left Arm)   Pulse 85   Temp 98.7 F (37.1 C) (Oral)   Resp 18   Ht 5\' 7"  (1.702 m)   Wt 82.6 kg   SpO2 100%   BMI 28.51 kg/m   Physical Exam Vitals signs and nursing note reviewed.  Constitutional:      General: She is  in acute distress (Uncomfortable).     Appearance: Normal appearance. She is well-developed and normal weight. She is not ill-appearing, toxic-appearing or diaphoretic.  HENT:     Head: Normocephalic and atraumatic.  Eyes:     Conjunctiva/sclera: Conjunctivae normal.     Pupils: Pupils are equal, round, and reactive to light.  Neck:     Musculoskeletal: Normal range of motion and neck supple.     Trachea: Phonation normal.  Cardiovascular:     Rate and Rhythm: Normal rate and regular rhythm.  Pulmonary:     Effort: Pulmonary effort is normal.     Breath sounds: Normal breath sounds.  Chest:     Chest wall: No tenderness.  Abdominal:     General: There is no distension.     Palpations: Abdomen is soft. There is no mass.     Tenderness: There is abdominal tenderness (Lower abdomen, moderate). There is no guarding.  Genitourinary:    Comments: Normal external female genitalia.  No evidence for injury, as a source of bleeding on the external genitalia.  On visual speculum examination, there is no injury to the vaginal walls, or bleeding from the cervix.  There is a small amount of opaque vaginal discharge.  Cervix appears long and closed. Musculoskeletal: Normal range of motion.  Skin:    General: Skin is warm and dry.  Neurological:     Mental Status: She is alert and oriented to person, place, and time.     Motor: No abnormal muscle tone.  Psychiatric:        Behavior: Behavior normal.        Thought Content: Thought content normal.        Judgment: Judgment normal.      ED Treatments / Results  Labs (all labs ordered are listed, but only abnormal results are displayed) Labs Reviewed  URINALYSIS, ROUTINE W REFLEX MICROSCOPIC    EKG None  Radiology No results found.  Procedures Procedures (including critical care time)  Medications Ordered in ED Medications  HYDROmorphone (DILAUDID) injection 0.5 mg (0.5 mg Intramuscular Given 10/09/18 1836)     Initial  Impression / Assessment and Plan / ED Course  I have reviewed the triage vital signs and the nursing notes.  Pertinent labs & imaging results that were available during my care of the patient were reviewed by me and considered in my medical decision making (see chart for details).  Clinical Course as of Oct 09 2229  Tue Oct 09, 2018  2231 Normal except large bowel blood on dipstick with small leukocytes.  Microscopic no red cells, 6-10 white cells and rare bacteria.  Urinalysis, Routine w reflex microscopic- may I&O cath if menses(!) [EW]    Clinical Course User Index [EW] Mancel BaleWentz, Sudeep Scheibel, MD        Patient Vitals for the past 24 hrs:  BP Temp Temp src Pulse Resp SpO2 Height Weight  10/09/18 1842 121/70 - - 85 18 100 % - -  10/09/18 1816 119/78 98.7 F (37.1 C) Oral 100 17 100 % - -  10/09/18 1814 - - - - - - 5\' 7"  (1.702 m) 82.6 kg    10:42 PM Reevaluation with update and discussion. After initial assessment and treatment, an updated evaluation reveals urinary catheter with 800 cc output of yellow urine.  Patient has resolution of her lower abdominal pain, after catheterization.  Findings discussed with patient and her husband, all questions answered. Mancel BaleElliott Jream Broyles   Medical Decision Making: Lower abdominal pain, in pregnancy, with signs and symptoms of urinary retention.  Discomfort improved after placement of urinary catheter.  Patient had large volume retention, greater than 400 cc.  She will need to keep the catheter in until seen by urology.  Doubt pregnancy complication.  Fetal heart tones are reassuring.  Patient has not yet experienced fetal movement, with this pregnancy.  There are no signs of preterm labor at this time.  There is no evidence for vaginal bleeding that the patient saw previously.  There is no indication for further ED intervention, evaluation or treatment.  CRITICAL CARE-no Performed by: Mancel BaleElliott Tyreanna Bisesi   Patient Vitals for the past 24 hrs:  BP Temp Temp src  Pulse Resp SpO2 Height Weight  10/09/18 2243 117/72 98.3 F (36.8 C) Oral 89 (!) 21 100 % - -  10/09/18 2204 117/70 - - 85 17 100 % - -  10/09/18 1842 121/70 - - 85 18 100 % - -  10/09/18 1816 119/78 98.7 F (37.1 C) Oral 100 17 100 % - -  10/09/18 1814 - - - - - - 5\' 7"  (1.702 m) 82.6 kg   Nursing Notes Reviewed/ Care Coordinated Applicable Imaging Reviewed Interpretation of Laboratory Data incorporated into ED treatment  The patient appears reasonably screened and/or stabilized for discharge and I doubt any other medical condition or other [facility] requiring further screening, evaluation, or treatment in the ED at this time prior to discharge.  Plan: Home Medications-continue current medication; Home Treatments-rest, fluids, catheter care at home; return here if the recommended treatment, does not improve the symptoms; Recommended follow up-urology follow-up 1 to 3 days, OB follow-up PRN    Mancel BaleWentz, Chauncey Bruno, MD 10/09/18 2245

## 2018-10-09 NOTE — ED Notes (Signed)
Patient is picked up by [facility]

## 2018-10-09 NOTE — ED Notes (Signed)
Pt was unable to give sample.  RN notified.

## 2018-10-09 NOTE — ED Notes (Addendum)
Patient is having constant aching suprapubic pain. When patient tries to void she has sever cramping/tight pain in suprapubic area. Will continue to monitor. No orders for IV or blood work at this time. Patient does not qualify to call [facility] nurse at this time. Patient is [redacted] weeks pregnant and has a OB doctor that she has seen once.

## 2018-10-09 NOTE — ED Notes (Signed)
Fetal heart tones heard. [id number]

## 2018-10-10 DIAGNOSIS — R102 Pelvic and perineal pain: Secondary | ICD-10-CM | POA: Diagnosis not present

## 2018-10-10 DIAGNOSIS — N3010 Interstitial cystitis (chronic) without hematuria: Secondary | ICD-10-CM | POA: Diagnosis not present

## 2018-10-10 DIAGNOSIS — R109 Unspecified abdominal pain: Secondary | ICD-10-CM | POA: Diagnosis not present

## 2018-10-15 DIAGNOSIS — J3081 Allergic rhinitis due to animal (cat) (dog) hair and dander: Secondary | ICD-10-CM | POA: Diagnosis not present

## 2018-10-15 DIAGNOSIS — J3089 Other allergic rhinitis: Secondary | ICD-10-CM | POA: Diagnosis not present

## 2018-10-15 DIAGNOSIS — R3914 Feeling of incomplete bladder emptying: Secondary | ICD-10-CM | POA: Diagnosis not present

## 2018-10-15 DIAGNOSIS — J301 Allergic rhinitis due to pollen: Secondary | ICD-10-CM | POA: Diagnosis not present

## 2018-10-16 DIAGNOSIS — M6289 Other specified disorders of muscle: Secondary | ICD-10-CM | POA: Diagnosis not present

## 2018-10-16 DIAGNOSIS — K5900 Constipation, unspecified: Secondary | ICD-10-CM | POA: Diagnosis not present

## 2018-10-16 DIAGNOSIS — R3914 Feeling of incomplete bladder emptying: Secondary | ICD-10-CM | POA: Diagnosis not present

## 2018-10-16 DIAGNOSIS — R102 Pelvic and perineal pain: Secondary | ICD-10-CM | POA: Diagnosis not present

## 2018-10-16 DIAGNOSIS — M6281 Muscle weakness (generalized): Secondary | ICD-10-CM | POA: Diagnosis not present

## 2018-10-25 DIAGNOSIS — M62838 Other muscle spasm: Secondary | ICD-10-CM | POA: Diagnosis not present

## 2018-10-25 DIAGNOSIS — R102 Pelvic and perineal pain: Secondary | ICD-10-CM | POA: Diagnosis not present

## 2018-10-25 DIAGNOSIS — J3081 Allergic rhinitis due to animal (cat) (dog) hair and dander: Secondary | ICD-10-CM | POA: Diagnosis not present

## 2018-10-25 DIAGNOSIS — J301 Allergic rhinitis due to pollen: Secondary | ICD-10-CM | POA: Diagnosis not present

## 2018-10-25 DIAGNOSIS — M6289 Other specified disorders of muscle: Secondary | ICD-10-CM | POA: Diagnosis not present

## 2018-10-25 DIAGNOSIS — M6281 Muscle weakness (generalized): Secondary | ICD-10-CM | POA: Diagnosis not present

## 2018-10-25 DIAGNOSIS — J3089 Other allergic rhinitis: Secondary | ICD-10-CM | POA: Diagnosis not present

## 2018-10-30 DIAGNOSIS — Z363 Encounter for antenatal screening for malformations: Secondary | ICD-10-CM | POA: Diagnosis not present

## 2018-11-01 DIAGNOSIS — J301 Allergic rhinitis due to pollen: Secondary | ICD-10-CM | POA: Diagnosis not present

## 2018-11-01 DIAGNOSIS — J3081 Allergic rhinitis due to animal (cat) (dog) hair and dander: Secondary | ICD-10-CM | POA: Diagnosis not present

## 2018-11-01 DIAGNOSIS — J3089 Other allergic rhinitis: Secondary | ICD-10-CM | POA: Diagnosis not present

## 2018-11-06 ENCOUNTER — Inpatient Hospital Stay (HOSPITAL_COMMUNITY)
Admission: AD | Admit: 2018-11-06 | Discharge: 2018-11-07 | Disposition: A | Discharge status: Home / Self Care | Payer: BLUE CROSS/BLUE SHIELD | Attending: Family Medicine | Admitting: Family Medicine

## 2018-11-06 ENCOUNTER — Other Ambulatory Visit: Payer: Self-pay

## 2018-11-06 ENCOUNTER — Encounter (HOSPITAL_COMMUNITY): Payer: Self-pay | Admitting: *Deleted

## 2018-11-06 DIAGNOSIS — O42912 Preterm premature rupture of membranes, unspecified as to length of time between rupture and onset of labor, second trimester: Secondary | ICD-10-CM | POA: Diagnosis not present

## 2018-11-06 DIAGNOSIS — O42919 Preterm premature rupture of membranes, unspecified as to length of time between rupture and onset of labor, unspecified trimester: Secondary | ICD-10-CM

## 2018-11-06 DIAGNOSIS — Z79899 Other long term (current) drug therapy: Secondary | ICD-10-CM | POA: Diagnosis not present

## 2018-11-06 DIAGNOSIS — Z3A18 18 weeks gestation of pregnancy: Secondary | ICD-10-CM | POA: Diagnosis not present

## 2018-11-06 DIAGNOSIS — F22 Delusional disorders: Secondary | ICD-10-CM | POA: Insufficient documentation

## 2018-11-06 DIAGNOSIS — O26892 Other specified pregnancy related conditions, second trimester: Secondary | ICD-10-CM | POA: Insufficient documentation

## 2018-11-06 DIAGNOSIS — K219 Gastro-esophageal reflux disease without esophagitis: Secondary | ICD-10-CM | POA: Diagnosis not present

## 2018-11-06 DIAGNOSIS — J45909 Unspecified asthma, uncomplicated: Secondary | ICD-10-CM | POA: Insufficient documentation

## 2018-11-06 DIAGNOSIS — N809 Endometriosis, unspecified: Secondary | ICD-10-CM | POA: Insufficient documentation

## 2018-11-06 LAB — CBC WITH DIFFERENTIAL/PLATELET
ABS IMMATURE GRANULOCYTES: 0.07 10*3/uL (ref 0.00–0.07)
BASOS ABS: 0 10*3/uL (ref 0.0–0.1)
BASOS PCT: 0 %
Eosinophils Absolute: 0.1 10*3/uL (ref 0.0–0.5)
Eosinophils Relative: 1 %
HCT: 34.8 % — ABNORMAL LOW (ref 36.0–46.0)
Hemoglobin: 11.4 g/dL — ABNORMAL LOW (ref 12.0–15.0)
Immature Granulocytes: 1 %
LYMPHS PCT: 19 %
Lymphs Abs: 2.5 10*3/uL (ref 0.7–4.0)
MCH: 29.5 pg (ref 26.0–34.0)
MCHC: 32.8 g/dL (ref 30.0–36.0)
MCV: 89.9 fL (ref 80.0–100.0)
MONO ABS: 1 10*3/uL (ref 0.1–1.0)
Monocytes Relative: 8 %
NEUTROS ABS: 9.2 10*3/uL — AB (ref 1.7–7.7)
NRBC: 0 % (ref 0.0–0.2)
Neutrophils Relative %: 71 %
PLATELETS: 256 10*3/uL (ref 150–400)
RBC: 3.87 MIL/uL (ref 3.87–5.11)
RDW: 13 % (ref 11.5–15.5)
WBC: 12.9 10*3/uL — AB (ref 4.0–10.5)

## 2018-11-06 LAB — POCT FERN TEST: POCT FERN TEST: POSITIVE

## 2018-11-06 NOTE — Discharge Instructions (Signed)
Premature Rupture and Preterm Premature Rupture of Membranes  Rupture of membranes is when the membranes (amniotic sac) that hold your baby break open. This is commonly referred to as your "water breaking." If your water breaks before labor starts (prematurely), it is called premature rupture of membranes (PROM). If PROM occurs before 37 weeks of pregnancy, it is called preterm premature rupture of membranes (PPROM). Because the amniotic sac keeps infection out and performs other important functions, having the amniotic sac rupture before 37 weeks of pregnancy can lead to serious problems. It requires immediate attention from a health care provider. What are the causes? When PROM occurs at 37 weeks of pregnancy or later, it is usually caused by natural weakening of the membranes and friction caused by contractions. PPROM is usually caused by infection. In many cases, the cause is not known. What increases the risk of PPROM? The following factors may make you more likely to have PPROM:  Infection.  Having had PPROM in a previous pregnancy.  Short cervical length.  Bleeding during the second or third trimester.  Low BMI, which is an estimate of body fat.  Smoking.  Using drugs.  Low socioeconomic status. What problems can be caused by PROM and PPROM? This condition creates health dangers for the mother and the baby. These include:  Delivering a premature baby.  Getting a serious infection of the placental tissues (chorioamnionitis).  Early detachment of the placenta from the uterus (placental abruption).  Compression of the umbilical cord.  Developing a serious infection after delivery. What are the signs of PROM and PPROM? Signs of this condition include:  A sudden gush or slow leaking of fluid from the vagina.  Constant wet underwear. Sometimes, women mistake the leaking or wetness for urine, especially if the leak is slow and not a gush of fluid. If there is constant  leaking or if your underwear continues to get wet, your membranes have likely ruptured. What should I do if I think my membranes have ruptured?  Call your health care provider right away.  You will need to go to the hospital immediately to be checked by a health care provider. What happens if I am diagnosed with PROM or PPROM? Once you arrive at the hospital, you will have tests done. A cervical exam will be done using a lubricated instrument (speculum) to check whether the cervix has softened or started to open (dilate).  If you are diagnosed with PROM, your labor may be started for you (you may be induced) within 24 hours if you are not having contractions.  If you are diagnosed with PPROM and you are not having contractions, you may be induced depending on your trimester. If you have PPROM:  You and your baby will be monitored closely for signs of infection or other complications.  You may be given: ? An antibiotic medicine to lower the chances of developing an infection. (not indicated unless you develop a fever) ? A steroid medicine to help mature the baby's lungs more quickly. (not indicated now) ? A medicine to help prevent cerebral palsy in your baby. (not indicated now) ? A medicine to stop preterm labor. (not indicated now)  You may be ordered to be on bed rest at home or in the hospital.  You may be induced if complications occur for you or the baby. Your treatment will depend on many factors, such as how many weeks you have been pregnant (how far along you are), the development of the baby,  and other complications that may occur. This information is not intended to replace advice given to you by your health care provider. Make sure you discuss any questions you have with your health care provider. Document Released: 08/08/2005 Document Revised: 03/24/2017 Document Reviewed: 03/14/2016 Elsevier Interactive Patient Education  2019 [employer].

## 2018-11-06 NOTE — MAU Provider Note (Signed)
Chief Complaint:  No chief complaint on file.   First Provider Initiated Contact with Patient 11/06/18 2131      HPI: Sherry Fuentes is a 33 y.o. G1P0 at 81w4dwho presents to maternity admissions reporting passing a bubble of fluid then feeling wetness on clothing.  Also saw some bleeding.  No pain.  Gets prenatal care in [place] even though she lives here.  States was referred to that doctor because of her endometriosis.. She denies vaginal itching/burning, urinary symptoms, h/a, dizziness, n/v, diarrhea, constipation or fever/chills.    RN Note:   PT SAYS AT 830PM-  SHE WAS HAVING BM- FELT PRESSURE IN VAG-   PASSED STOOL- BUT THEN SAW BUBBLE OF CLEAR FLUID COMING OUT OF VAG - HAS PAD ON  AND FEELS WET.   HAD  COUPLE OF DROPS OF BLOOD COME OUT.   PNC-  [place].    LAST MTH - WENT TO ER- BC BLADDER WAS RETAINING URINE.    Past Medical History: Past Medical History:  Diagnosis Date  . Asthma   . Frequency of urination   . GERD (gastroesophageal reflux disease)   . Interstitial cystitis   . Nocturia   . Pelvic pain in female   . Urgency of urination     Past obstetric history: OB History  Gravida Para Term Preterm AB Living  1            SAB TAB Ectopic Multiple Live Births               # Outcome Date GA Lbr Len/2nd Weight Sex Delivery Anes PTL Lv  1 Current             Past Surgical History: Past Surgical History:  Procedure Laterality Date  . CYSTO WITH HYDRODISTENSION  08/02/2012   Procedure: CYSTOSCOPY/HYDRODISTENSION;  Surgeon: Martina Sinner, MD;  Location: [facility];  Service: Urology;  Laterality: N/A;  Cystoscopy/Hydrodistension Instillation of Marcaine and Pyridum  . LAPAROSCOPY N/A 11/16/2015   Procedure: LAPAROSCOPY DIAGNOSTIC; LYSIS OF ADHESIONS;  Surgeon: Richarda Overlie, MD;  Location: [facility] [place];  Service: Gynecology;  Laterality: N/A;  . NEGATIVE SLEEP STUDY  2015  . REMOVAL RIGHT WRIST CYST  2011  . TONSILLECTOMY  2015  .  WISDOM TOOTH EXTRACTION  AGE 67    Family History: No family history on file.  Social History: Social History   Tobacco Use  . Smoking status: Never Smoker  . Smokeless tobacco: Never Used  Substance Use Topics  . Alcohol use: Not Currently    Comment: RARE  . Drug use: No    Allergies: No Known Allergies  Meds:  Medications Prior to Admission  Medication Sig Dispense Refill Last Dose  . Ascorbic Acid (VITAMIN C) 1000 MG tablet Take 1,000 mg by mouth daily.   10/08/2018 at Unknown time  . azelastine (ASTELIN) 0.1 % nasal spray Place 2 sprays into both nostrils 2 (two) times daily. Use in each nostril as directed   10/09/2018 at Unknown time  . azelastine (OPTIVAR) 0.05 % ophthalmic solution Place 1 drop into both eyes 2 (two) times daily.   10/09/2018 at Unknown time  . BREO ELLIPTA 100-25 MCG/INH AEPB Inhale 1 puff into the lungs daily.   5 10/09/2018 at Unknown time  . Cholecalciferol (VITAMIN D-3 PO) Take 5,000 Units by mouth daily.    10/08/2018 at Unknown time  . cyclobenzaprine (FLEXERIL) 10 MG tablet Take 10 mg by mouth 3 (three) times daily  as needed for muscle spasms.   10/09/2018 at Unknown time  . EPINEPHrine 0.3 mg/0.3 mL IJ SOAJ injection Inject 0.3 mg into the muscle 3 times/day as needed-between meals & bedtime for allergies.   on hand  . fluticasone (FLONASE) 50 MCG/ACT nasal spray Place 1 spray into both nostrils daily.   10/09/2018 at Unknown time  . IODINE, KELP, PO Take 232 mcg by mouth daily.   10/08/2018 at Unknown time  . levocetirizine (XYZAL) 5 MG tablet Take 5 mg by mouth every evening.   10/09/2018 at Unknown time  . montelukast (SINGULAIR) 10 MG tablet Take 10 mg by mouth at bedtime.   10/09/2018 at Unknown time  . Omega 3 1000 MG CAPS Take 1,000 mg by mouth daily.   10/08/2018 at Unknown time  . Prenatal Vit-Fe Fumarate-FA (PRENATAL MULTIVITAMIN) TABS tablet Take 3 tablets by mouth daily at 12 noon.   10/08/2018 at Unknown time  . PROAIR HFA 108 (90 Base) MCG/ACT  inhaler Inhale 2 puffs into the lungs every 6 (six) hours as needed for wheezing or shortness of breath.   1 Past Month at Unknown time  . Probiotic Product (PROBIOTIC PO) Take 1 tablet by mouth daily.   10/09/2018 at Unknown time  . vitamin B-12 (CYANOCOBALAMIN) 1000 MCG tablet Take 1,000 mcg by mouth every 3 (three) days.    Past Week at Unknown time    I have reviewed patient's Past Medical Hx, Surgical Hx, Family Hx, Social Hx, medications and allergies.   ROS:  Review of Systems  Constitutional: Negative for chills and fever.  Respiratory: Negative for shortness of breath.   Gastrointestinal: Negative for abdominal pain, constipation, diarrhea and nausea.  Genitourinary: Positive for vaginal bleeding and vaginal discharge. Negative for dysuria and pelvic pain.   Other systems negative  Physical Exam   Patient Vitals for the past 24 hrs:  BP Temp Temp src Pulse Resp Height Weight  11/06/18 2118 135/75 98.6 F (37 C) Oral (!) 103 18 5\' 7"  (1.702 m) 88.5 kg   Constitutional: Well-developed, well-nourished female in no acute distress.  Cardiovascular: normal rate and rhythm Respiratory: normal effort, clear to auscultation bilaterally GI: Abd soft, non-tender, gravid appropriate for gestational age.   No rebound or guarding. MS: Extremities nontender, no edema, normal ROM Neurologic: Alert and oriented x 4.  GU: Neg CVAT.  PELVIC EXAM: Cervix pink, visually closed, without lesion,                           Gross pooling of burgundy, watery fluid in vault                            + ferning  FHT:  145   Labs: Results for orders placed or performed during the hospital encounter of 11/06/18 (from the past 24 hour(s))  Fern Test     Status: Abnormal   Collection Time: 11/06/18  9:53 PM  Result Value Ref Range   POCT Fern Test Positive = ruptured amniotic membanes   CBC with Differential/Platelet     Status: Abnormal   Collection Time: 11/06/18 11:14 PM  Result Value Ref  Range   WBC 12.9 (H) 4.0 - 10.5 K/uL   RBC 3.87 3.87 - 5.11 MIL/uL   Hemoglobin 11.4 (L) 12.0 - 15.0 g/dL   HCT 40.934.8 (L) 81.136.0 - 91.446.0 %   MCV 89.9 80.0 - 100.0 fL  MCH 29.5 26.0 - 34.0 pg   MCHC 32.8 30.0 - 36.0 g/dL   RDW 63.7 85.8 - 85.0 %   Platelets 256 150 - 400 K/uL   nRBC 0.0 0.0 - 0.2 %   Neutrophils Relative % 71 %   Neutro Abs 9.2 (H) 1.7 - 7.7 K/uL   Lymphocytes Relative 19 %   Lymphs Abs 2.5 0.7 - 4.0 K/uL   Monocytes Relative 8 %   Monocytes Absolute 1.0 0.1 - 1.0 K/uL   Eosinophils Relative 1 %   Eosinophils Absolute 0.1 0.0 - 0.5 K/uL   Basophils Relative 0 %   Basophils Absolute 0.0 0.0 - 0.1 K/uL   Immature Granulocytes 1 %   Abs Immature Granulocytes 0.07 0.00 - 0.07 K/uL     Imaging:    MAU Course/MDM: I have ordered labs and reviewed results. Slight leukocytosis.  Consult Dr Adrian Blackwater with presentation, exam findings and test results.  Discussed options with patient and her husband including expectant management and watchful waiting vs induction of labor which would require 72 hour wait. Discussed at this gestational age, the baby is remote from viability Discussed low amniotic fluid may interfere with development of lung tissue Also discussed risk of infection which would threaten her health and also the fetus  She opted to contact her doctor in [place] and decided to proceed expectantly .  Her doctor suggested she be admitted overnight for observation of fever.  I explained that Dr Adrian Blackwater did not feel it was necessary to admit her, and that fever may or may not occur, and may not happen in the first 24 hours.   I suggested she call her doctor in the morning and discuss it again, and that her doctor may want her admitted over there  Assessment: Single intrauterine pregnancy at [redacted]w[redacted]d Preterm Premature Rupture of membranes  Plan: Discharge home Preterm Labor precautions  Take temperature twice daily and report any fever over 99 Follow up in Office for  prenatal visits and recheck of status  Encouraged to return here or to other Urgent Care/ED if she develops worsening of symptoms, increase in pain, fever, or other concerning symptoms.   Pt stable at time of discharge.  Wynelle Bourgeois CNM, MSN Certified Nurse-Midwife 11/06/2018 9:31 PM

## 2018-11-06 NOTE — MAU Note (Signed)
PT SAYS AT 830PM-  SHE WAS HAVING BM- FELT PRESSURE IN VAG-   PASSED STOOL- BUT THEN SAW BUBBLE OF CLEAR FLUID COMING OUT OF VAG - HAS PAD ON  AND FEELS WET.   HAD  COUPLE OF DROPS OF BLOOD COME OUT.   PNC-  [place].    LAST MTH - WENT TO ER- BC BLADDER WAS RETAINING URINE.

## 2018-11-07 DIAGNOSIS — J45909 Unspecified asthma, uncomplicated: Secondary | ICD-10-CM | POA: Diagnosis not present

## 2018-11-07 DIAGNOSIS — Z7951 Long term (current) use of inhaled steroids: Secondary | ICD-10-CM | POA: Diagnosis not present

## 2018-11-07 DIAGNOSIS — Z79899 Other long term (current) drug therapy: Secondary | ICD-10-CM | POA: Diagnosis not present

## 2018-11-07 DIAGNOSIS — O26892 Other specified pregnancy related conditions, second trimester: Secondary | ICD-10-CM | POA: Diagnosis not present

## 2018-11-07 DIAGNOSIS — O42112 Preterm premature rupture of membranes, onset of labor more than 24 hours following rupture, second trimester: Secondary | ICD-10-CM | POA: Diagnosis not present

## 2018-11-07 DIAGNOSIS — O021 Missed abortion: Secondary | ICD-10-CM | POA: Diagnosis not present

## 2018-11-07 DIAGNOSIS — O4102X0 Oligohydramnios, second trimester, not applicable or unspecified: Secondary | ICD-10-CM | POA: Diagnosis not present

## 2018-11-07 DIAGNOSIS — Z3402 Encounter for supervision of normal first pregnancy, second trimester: Secondary | ICD-10-CM | POA: Diagnosis not present

## 2018-11-07 DIAGNOSIS — Z91013 Allergy to seafood: Secondary | ICD-10-CM | POA: Diagnosis not present

## 2018-11-07 DIAGNOSIS — O42912 Preterm premature rupture of membranes, unspecified as to length of time between rupture and onset of labor, second trimester: Secondary | ICD-10-CM | POA: Diagnosis not present

## 2018-11-07 DIAGNOSIS — Z3A18 18 weeks gestation of pregnancy: Secondary | ICD-10-CM | POA: Diagnosis not present

## 2018-11-07 DIAGNOSIS — K219 Gastro-esophageal reflux disease without esophagitis: Secondary | ICD-10-CM | POA: Diagnosis not present

## 2018-11-26 DIAGNOSIS — J4530 Mild persistent asthma, uncomplicated: Secondary | ICD-10-CM | POA: Diagnosis not present

## 2018-11-26 DIAGNOSIS — J301 Allergic rhinitis due to pollen: Secondary | ICD-10-CM | POA: Diagnosis not present

## 2018-11-26 DIAGNOSIS — J3089 Other allergic rhinitis: Secondary | ICD-10-CM | POA: Diagnosis not present

## 2018-11-26 DIAGNOSIS — H1045 Other chronic allergic conjunctivitis: Secondary | ICD-10-CM | POA: Diagnosis not present

## 2018-11-27 DIAGNOSIS — J301 Allergic rhinitis due to pollen: Secondary | ICD-10-CM | POA: Diagnosis not present

## 2018-11-27 DIAGNOSIS — J3081 Allergic rhinitis due to animal (cat) (dog) hair and dander: Secondary | ICD-10-CM | POA: Diagnosis not present

## 2018-11-27 DIAGNOSIS — J3089 Other allergic rhinitis: Secondary | ICD-10-CM | POA: Diagnosis not present

## 2018-12-04 DIAGNOSIS — J3089 Other allergic rhinitis: Secondary | ICD-10-CM | POA: Diagnosis not present

## 2018-12-04 DIAGNOSIS — J301 Allergic rhinitis due to pollen: Secondary | ICD-10-CM | POA: Diagnosis not present

## 2018-12-04 DIAGNOSIS — J3081 Allergic rhinitis due to animal (cat) (dog) hair and dander: Secondary | ICD-10-CM | POA: Diagnosis not present

## 2018-12-11 DIAGNOSIS — J3081 Allergic rhinitis due to animal (cat) (dog) hair and dander: Secondary | ICD-10-CM | POA: Diagnosis not present

## 2018-12-11 DIAGNOSIS — J301 Allergic rhinitis due to pollen: Secondary | ICD-10-CM | POA: Diagnosis not present

## 2018-12-11 DIAGNOSIS — J3089 Other allergic rhinitis: Secondary | ICD-10-CM | POA: Diagnosis not present

## 2018-12-18 DIAGNOSIS — J3089 Other allergic rhinitis: Secondary | ICD-10-CM | POA: Diagnosis not present

## 2018-12-18 DIAGNOSIS — J301 Allergic rhinitis due to pollen: Secondary | ICD-10-CM | POA: Diagnosis not present

## 2018-12-18 DIAGNOSIS — J3081 Allergic rhinitis due to animal (cat) (dog) hair and dander: Secondary | ICD-10-CM | POA: Diagnosis not present

## 2018-12-26 DIAGNOSIS — J3081 Allergic rhinitis due to animal (cat) (dog) hair and dander: Secondary | ICD-10-CM | POA: Diagnosis not present

## 2018-12-26 DIAGNOSIS — M6289 Other specified disorders of muscle: Secondary | ICD-10-CM | POA: Diagnosis not present

## 2018-12-26 DIAGNOSIS — M62838 Other muscle spasm: Secondary | ICD-10-CM | POA: Diagnosis not present

## 2018-12-26 DIAGNOSIS — J301 Allergic rhinitis due to pollen: Secondary | ICD-10-CM | POA: Diagnosis not present

## 2018-12-26 DIAGNOSIS — R102 Pelvic and perineal pain: Secondary | ICD-10-CM | POA: Diagnosis not present

## 2018-12-26 DIAGNOSIS — M6281 Muscle weakness (generalized): Secondary | ICD-10-CM | POA: Diagnosis not present

## 2018-12-26 DIAGNOSIS — J3089 Other allergic rhinitis: Secondary | ICD-10-CM | POA: Diagnosis not present

## 2019-01-01 DIAGNOSIS — J3089 Other allergic rhinitis: Secondary | ICD-10-CM | POA: Diagnosis not present

## 2019-01-01 DIAGNOSIS — J3081 Allergic rhinitis due to animal (cat) (dog) hair and dander: Secondary | ICD-10-CM | POA: Diagnosis not present

## 2019-01-01 DIAGNOSIS — J301 Allergic rhinitis due to pollen: Secondary | ICD-10-CM | POA: Diagnosis not present

## 2019-01-03 DIAGNOSIS — Z8759 Personal history of other complications of pregnancy, childbirth and the puerperium: Secondary | ICD-10-CM | POA: Diagnosis not present

## 2019-01-03 DIAGNOSIS — N859 Noninflammatory disorder of uterus, unspecified: Secondary | ICD-10-CM | POA: Diagnosis not present

## 2019-01-03 DIAGNOSIS — N925 Other specified irregular menstruation: Secondary | ICD-10-CM | POA: Diagnosis not present

## 2019-01-08 DIAGNOSIS — J3081 Allergic rhinitis due to animal (cat) (dog) hair and dander: Secondary | ICD-10-CM | POA: Diagnosis not present

## 2019-01-08 DIAGNOSIS — J301 Allergic rhinitis due to pollen: Secondary | ICD-10-CM | POA: Diagnosis not present

## 2019-01-08 DIAGNOSIS — J3089 Other allergic rhinitis: Secondary | ICD-10-CM | POA: Diagnosis not present

## 2019-01-10 DIAGNOSIS — J301 Allergic rhinitis due to pollen: Secondary | ICD-10-CM | POA: Diagnosis not present

## 2019-01-10 DIAGNOSIS — J3081 Allergic rhinitis due to animal (cat) (dog) hair and dander: Secondary | ICD-10-CM | POA: Diagnosis not present

## 2019-01-10 DIAGNOSIS — R300 Dysuria: Secondary | ICD-10-CM | POA: Diagnosis not present

## 2019-01-10 DIAGNOSIS — J3089 Other allergic rhinitis: Secondary | ICD-10-CM | POA: Diagnosis not present

## 2019-01-10 DIAGNOSIS — N3010 Interstitial cystitis (chronic) without hematuria: Secondary | ICD-10-CM | POA: Diagnosis not present

## 2019-01-17 DIAGNOSIS — J3089 Other allergic rhinitis: Secondary | ICD-10-CM | POA: Diagnosis not present

## 2019-01-17 DIAGNOSIS — M62838 Other muscle spasm: Secondary | ICD-10-CM | POA: Diagnosis not present

## 2019-01-17 DIAGNOSIS — R102 Pelvic and perineal pain: Secondary | ICD-10-CM | POA: Diagnosis not present

## 2019-01-17 DIAGNOSIS — M6289 Other specified disorders of muscle: Secondary | ICD-10-CM | POA: Diagnosis not present

## 2019-01-17 DIAGNOSIS — J301 Allergic rhinitis due to pollen: Secondary | ICD-10-CM | POA: Diagnosis not present

## 2019-01-17 DIAGNOSIS — J3081 Allergic rhinitis due to animal (cat) (dog) hair and dander: Secondary | ICD-10-CM | POA: Diagnosis not present

## 2019-01-17 DIAGNOSIS — M6281 Muscle weakness (generalized): Secondary | ICD-10-CM | POA: Diagnosis not present

## 2019-01-18 DIAGNOSIS — J4520 Mild intermittent asthma, uncomplicated: Secondary | ICD-10-CM | POA: Diagnosis not present

## 2019-01-23 DIAGNOSIS — J301 Allergic rhinitis due to pollen: Secondary | ICD-10-CM | POA: Diagnosis not present

## 2019-01-23 DIAGNOSIS — J3089 Other allergic rhinitis: Secondary | ICD-10-CM | POA: Diagnosis not present

## 2019-01-23 DIAGNOSIS — J3081 Allergic rhinitis due to animal (cat) (dog) hair and dander: Secondary | ICD-10-CM | POA: Diagnosis not present

## 2019-01-24 DIAGNOSIS — M62838 Other muscle spasm: Secondary | ICD-10-CM | POA: Diagnosis not present

## 2019-01-24 DIAGNOSIS — M6289 Other specified disorders of muscle: Secondary | ICD-10-CM | POA: Diagnosis not present

## 2019-01-24 DIAGNOSIS — M6281 Muscle weakness (generalized): Secondary | ICD-10-CM | POA: Diagnosis not present

## 2019-01-24 DIAGNOSIS — R102 Pelvic and perineal pain: Secondary | ICD-10-CM | POA: Diagnosis not present

## 2019-01-31 DIAGNOSIS — J3089 Other allergic rhinitis: Secondary | ICD-10-CM | POA: Diagnosis not present

## 2019-01-31 DIAGNOSIS — R102 Pelvic and perineal pain: Secondary | ICD-10-CM | POA: Diagnosis not present

## 2019-01-31 DIAGNOSIS — M6281 Muscle weakness (generalized): Secondary | ICD-10-CM | POA: Diagnosis not present

## 2019-01-31 DIAGNOSIS — J3081 Allergic rhinitis due to animal (cat) (dog) hair and dander: Secondary | ICD-10-CM | POA: Diagnosis not present

## 2019-01-31 DIAGNOSIS — M62838 Other muscle spasm: Secondary | ICD-10-CM | POA: Diagnosis not present

## 2019-01-31 DIAGNOSIS — M6289 Other specified disorders of muscle: Secondary | ICD-10-CM | POA: Diagnosis not present

## 2019-01-31 DIAGNOSIS — J301 Allergic rhinitis due to pollen: Secondary | ICD-10-CM | POA: Diagnosis not present

## 2019-02-05 DIAGNOSIS — M6289 Other specified disorders of muscle: Secondary | ICD-10-CM | POA: Diagnosis not present

## 2019-02-05 DIAGNOSIS — J3089 Other allergic rhinitis: Secondary | ICD-10-CM | POA: Diagnosis not present

## 2019-02-05 DIAGNOSIS — J3081 Allergic rhinitis due to animal (cat) (dog) hair and dander: Secondary | ICD-10-CM | POA: Diagnosis not present

## 2019-02-05 DIAGNOSIS — M62838 Other muscle spasm: Secondary | ICD-10-CM | POA: Diagnosis not present

## 2019-02-05 DIAGNOSIS — M6281 Muscle weakness (generalized): Secondary | ICD-10-CM | POA: Diagnosis not present

## 2019-02-05 DIAGNOSIS — N941 Unspecified dyspareunia: Secondary | ICD-10-CM | POA: Diagnosis not present

## 2019-02-05 DIAGNOSIS — J301 Allergic rhinitis due to pollen: Secondary | ICD-10-CM | POA: Diagnosis not present

## 2019-02-12 DIAGNOSIS — K5900 Constipation, unspecified: Secondary | ICD-10-CM | POA: Diagnosis not present

## 2019-02-12 DIAGNOSIS — M6281 Muscle weakness (generalized): Secondary | ICD-10-CM | POA: Diagnosis not present

## 2019-02-12 DIAGNOSIS — M6289 Other specified disorders of muscle: Secondary | ICD-10-CM | POA: Diagnosis not present

## 2019-02-12 DIAGNOSIS — M62838 Other muscle spasm: Secondary | ICD-10-CM | POA: Diagnosis not present

## 2019-02-14 DIAGNOSIS — J3081 Allergic rhinitis due to animal (cat) (dog) hair and dander: Secondary | ICD-10-CM | POA: Diagnosis not present

## 2019-02-14 DIAGNOSIS — J3089 Other allergic rhinitis: Secondary | ICD-10-CM | POA: Diagnosis not present

## 2019-02-14 DIAGNOSIS — R102 Pelvic and perineal pain: Secondary | ICD-10-CM | POA: Diagnosis not present

## 2019-02-14 DIAGNOSIS — M6281 Muscle weakness (generalized): Secondary | ICD-10-CM | POA: Diagnosis not present

## 2019-02-14 DIAGNOSIS — M62838 Other muscle spasm: Secondary | ICD-10-CM | POA: Diagnosis not present

## 2019-02-14 DIAGNOSIS — M6289 Other specified disorders of muscle: Secondary | ICD-10-CM | POA: Diagnosis not present

## 2019-02-14 DIAGNOSIS — J301 Allergic rhinitis due to pollen: Secondary | ICD-10-CM | POA: Diagnosis not present

## 2019-02-19 DIAGNOSIS — R102 Pelvic and perineal pain: Secondary | ICD-10-CM | POA: Diagnosis not present

## 2019-02-19 DIAGNOSIS — M62838 Other muscle spasm: Secondary | ICD-10-CM | POA: Diagnosis not present

## 2019-02-19 DIAGNOSIS — J3081 Allergic rhinitis due to animal (cat) (dog) hair and dander: Secondary | ICD-10-CM | POA: Diagnosis not present

## 2019-02-19 DIAGNOSIS — M6289 Other specified disorders of muscle: Secondary | ICD-10-CM | POA: Diagnosis not present

## 2019-02-19 DIAGNOSIS — J3089 Other allergic rhinitis: Secondary | ICD-10-CM | POA: Diagnosis not present

## 2019-02-19 DIAGNOSIS — J301 Allergic rhinitis due to pollen: Secondary | ICD-10-CM | POA: Diagnosis not present

## 2019-02-19 DIAGNOSIS — M6281 Muscle weakness (generalized): Secondary | ICD-10-CM | POA: Diagnosis not present

## 2019-02-26 DIAGNOSIS — R3914 Feeling of incomplete bladder emptying: Secondary | ICD-10-CM | POA: Diagnosis not present

## 2019-02-26 DIAGNOSIS — M6281 Muscle weakness (generalized): Secondary | ICD-10-CM | POA: Diagnosis not present

## 2019-02-26 DIAGNOSIS — M6289 Other specified disorders of muscle: Secondary | ICD-10-CM | POA: Diagnosis not present

## 2019-02-26 DIAGNOSIS — M62838 Other muscle spasm: Secondary | ICD-10-CM | POA: Diagnosis not present

## 2019-02-26 DIAGNOSIS — J301 Allergic rhinitis due to pollen: Secondary | ICD-10-CM | POA: Diagnosis not present

## 2019-02-26 DIAGNOSIS — J3089 Other allergic rhinitis: Secondary | ICD-10-CM | POA: Diagnosis not present

## 2019-02-26 DIAGNOSIS — J3081 Allergic rhinitis due to animal (cat) (dog) hair and dander: Secondary | ICD-10-CM | POA: Diagnosis not present

## 2019-02-28 DIAGNOSIS — M6289 Other specified disorders of muscle: Secondary | ICD-10-CM | POA: Diagnosis not present

## 2019-02-28 DIAGNOSIS — R102 Pelvic and perineal pain: Secondary | ICD-10-CM | POA: Diagnosis not present

## 2019-02-28 DIAGNOSIS — M6281 Muscle weakness (generalized): Secondary | ICD-10-CM | POA: Diagnosis not present

## 2019-02-28 DIAGNOSIS — M62838 Other muscle spasm: Secondary | ICD-10-CM | POA: Diagnosis not present

## 2019-03-06 DIAGNOSIS — J3089 Other allergic rhinitis: Secondary | ICD-10-CM | POA: Diagnosis not present

## 2019-03-06 DIAGNOSIS — J301 Allergic rhinitis due to pollen: Secondary | ICD-10-CM | POA: Diagnosis not present

## 2019-03-06 DIAGNOSIS — M6289 Other specified disorders of muscle: Secondary | ICD-10-CM | POA: Diagnosis not present

## 2019-03-06 DIAGNOSIS — M6281 Muscle weakness (generalized): Secondary | ICD-10-CM | POA: Diagnosis not present

## 2019-03-06 DIAGNOSIS — J3081 Allergic rhinitis due to animal (cat) (dog) hair and dander: Secondary | ICD-10-CM | POA: Diagnosis not present

## 2019-03-06 DIAGNOSIS — R3914 Feeling of incomplete bladder emptying: Secondary | ICD-10-CM | POA: Diagnosis not present

## 2019-03-06 DIAGNOSIS — M62838 Other muscle spasm: Secondary | ICD-10-CM | POA: Diagnosis not present

## 2019-03-11 DIAGNOSIS — M6289 Other specified disorders of muscle: Secondary | ICD-10-CM | POA: Diagnosis not present

## 2019-03-11 DIAGNOSIS — M6281 Muscle weakness (generalized): Secondary | ICD-10-CM | POA: Diagnosis not present

## 2019-03-11 DIAGNOSIS — M62838 Other muscle spasm: Secondary | ICD-10-CM | POA: Diagnosis not present

## 2019-03-11 DIAGNOSIS — R3914 Feeling of incomplete bladder emptying: Secondary | ICD-10-CM | POA: Diagnosis not present

## 2019-03-13 DIAGNOSIS — J3081 Allergic rhinitis due to animal (cat) (dog) hair and dander: Secondary | ICD-10-CM | POA: Diagnosis not present

## 2019-03-13 DIAGNOSIS — M62838 Other muscle spasm: Secondary | ICD-10-CM | POA: Diagnosis not present

## 2019-03-13 DIAGNOSIS — R102 Pelvic and perineal pain: Secondary | ICD-10-CM | POA: Diagnosis not present

## 2019-03-13 DIAGNOSIS — M6289 Other specified disorders of muscle: Secondary | ICD-10-CM | POA: Diagnosis not present

## 2019-03-13 DIAGNOSIS — J3089 Other allergic rhinitis: Secondary | ICD-10-CM | POA: Diagnosis not present

## 2019-03-13 DIAGNOSIS — J301 Allergic rhinitis due to pollen: Secondary | ICD-10-CM | POA: Diagnosis not present

## 2019-03-13 DIAGNOSIS — M6281 Muscle weakness (generalized): Secondary | ICD-10-CM | POA: Diagnosis not present

## 2019-03-22 DIAGNOSIS — J3081 Allergic rhinitis due to animal (cat) (dog) hair and dander: Secondary | ICD-10-CM | POA: Diagnosis not present

## 2019-03-22 DIAGNOSIS — M6281 Muscle weakness (generalized): Secondary | ICD-10-CM | POA: Diagnosis not present

## 2019-03-22 DIAGNOSIS — J301 Allergic rhinitis due to pollen: Secondary | ICD-10-CM | POA: Diagnosis not present

## 2019-03-22 DIAGNOSIS — M6289 Other specified disorders of muscle: Secondary | ICD-10-CM | POA: Diagnosis not present

## 2019-03-22 DIAGNOSIS — M62838 Other muscle spasm: Secondary | ICD-10-CM | POA: Diagnosis not present

## 2019-03-22 DIAGNOSIS — J3089 Other allergic rhinitis: Secondary | ICD-10-CM | POA: Diagnosis not present

## 2019-03-22 DIAGNOSIS — R102 Pelvic and perineal pain: Secondary | ICD-10-CM | POA: Diagnosis not present

## 2019-03-29 DIAGNOSIS — J301 Allergic rhinitis due to pollen: Secondary | ICD-10-CM | POA: Diagnosis not present

## 2019-03-29 DIAGNOSIS — J3089 Other allergic rhinitis: Secondary | ICD-10-CM | POA: Diagnosis not present

## 2019-03-29 DIAGNOSIS — J3081 Allergic rhinitis due to animal (cat) (dog) hair and dander: Secondary | ICD-10-CM | POA: Diagnosis not present

## 2019-04-01 DIAGNOSIS — M6289 Other specified disorders of muscle: Secondary | ICD-10-CM | POA: Diagnosis not present

## 2019-04-01 DIAGNOSIS — R102 Pelvic and perineal pain: Secondary | ICD-10-CM | POA: Diagnosis not present

## 2019-04-01 DIAGNOSIS — M6281 Muscle weakness (generalized): Secondary | ICD-10-CM | POA: Diagnosis not present

## 2019-04-01 DIAGNOSIS — M62838 Other muscle spasm: Secondary | ICD-10-CM | POA: Diagnosis not present

## 2019-04-02 DIAGNOSIS — R102 Pelvic and perineal pain: Secondary | ICD-10-CM | POA: Diagnosis not present

## 2019-04-02 DIAGNOSIS — M6281 Muscle weakness (generalized): Secondary | ICD-10-CM | POA: Diagnosis not present

## 2019-04-02 DIAGNOSIS — J301 Allergic rhinitis due to pollen: Secondary | ICD-10-CM | POA: Diagnosis not present

## 2019-04-02 DIAGNOSIS — J3089 Other allergic rhinitis: Secondary | ICD-10-CM | POA: Diagnosis not present

## 2019-04-02 DIAGNOSIS — M6289 Other specified disorders of muscle: Secondary | ICD-10-CM | POA: Diagnosis not present

## 2019-04-02 DIAGNOSIS — J3081 Allergic rhinitis due to animal (cat) (dog) hair and dander: Secondary | ICD-10-CM | POA: Diagnosis not present

## 2019-04-02 DIAGNOSIS — M62838 Other muscle spasm: Secondary | ICD-10-CM | POA: Diagnosis not present

## 2019-04-12 DIAGNOSIS — J301 Allergic rhinitis due to pollen: Secondary | ICD-10-CM | POA: Diagnosis not present

## 2019-04-12 DIAGNOSIS — J3081 Allergic rhinitis due to animal (cat) (dog) hair and dander: Secondary | ICD-10-CM | POA: Diagnosis not present

## 2019-04-12 DIAGNOSIS — J3089 Other allergic rhinitis: Secondary | ICD-10-CM | POA: Diagnosis not present

## 2019-04-18 DIAGNOSIS — J3089 Other allergic rhinitis: Secondary | ICD-10-CM | POA: Diagnosis not present

## 2019-04-18 DIAGNOSIS — J3081 Allergic rhinitis due to animal (cat) (dog) hair and dander: Secondary | ICD-10-CM | POA: Diagnosis not present

## 2019-04-18 DIAGNOSIS — R102 Pelvic and perineal pain: Secondary | ICD-10-CM | POA: Diagnosis not present

## 2019-04-18 DIAGNOSIS — M6281 Muscle weakness (generalized): Secondary | ICD-10-CM | POA: Diagnosis not present

## 2019-04-18 DIAGNOSIS — M62838 Other muscle spasm: Secondary | ICD-10-CM | POA: Diagnosis not present

## 2019-04-18 DIAGNOSIS — J301 Allergic rhinitis due to pollen: Secondary | ICD-10-CM | POA: Diagnosis not present

## 2019-04-18 DIAGNOSIS — M6289 Other specified disorders of muscle: Secondary | ICD-10-CM | POA: Diagnosis not present

## 2019-04-22 DIAGNOSIS — J3081 Allergic rhinitis due to animal (cat) (dog) hair and dander: Secondary | ICD-10-CM | POA: Diagnosis not present

## 2019-04-22 DIAGNOSIS — M6281 Muscle weakness (generalized): Secondary | ICD-10-CM | POA: Diagnosis not present

## 2019-04-22 DIAGNOSIS — J301 Allergic rhinitis due to pollen: Secondary | ICD-10-CM | POA: Diagnosis not present

## 2019-04-22 DIAGNOSIS — M62838 Other muscle spasm: Secondary | ICD-10-CM | POA: Diagnosis not present

## 2019-04-22 DIAGNOSIS — J3089 Other allergic rhinitis: Secondary | ICD-10-CM | POA: Diagnosis not present

## 2019-04-22 DIAGNOSIS — K5900 Constipation, unspecified: Secondary | ICD-10-CM | POA: Diagnosis not present

## 2019-04-22 DIAGNOSIS — M6289 Other specified disorders of muscle: Secondary | ICD-10-CM | POA: Diagnosis not present

## 2019-04-25 DIAGNOSIS — J301 Allergic rhinitis due to pollen: Secondary | ICD-10-CM | POA: Diagnosis not present

## 2019-04-26 DIAGNOSIS — J3081 Allergic rhinitis due to animal (cat) (dog) hair and dander: Secondary | ICD-10-CM | POA: Diagnosis not present

## 2019-04-26 DIAGNOSIS — J3089 Other allergic rhinitis: Secondary | ICD-10-CM | POA: Diagnosis not present

## 2019-05-02 DIAGNOSIS — M62838 Other muscle spasm: Secondary | ICD-10-CM | POA: Diagnosis not present

## 2019-05-02 DIAGNOSIS — J3089 Other allergic rhinitis: Secondary | ICD-10-CM | POA: Diagnosis not present

## 2019-05-02 DIAGNOSIS — M6289 Other specified disorders of muscle: Secondary | ICD-10-CM | POA: Diagnosis not present

## 2019-05-02 DIAGNOSIS — J301 Allergic rhinitis due to pollen: Secondary | ICD-10-CM | POA: Diagnosis not present

## 2019-05-02 DIAGNOSIS — R102 Pelvic and perineal pain: Secondary | ICD-10-CM | POA: Diagnosis not present

## 2019-05-02 DIAGNOSIS — J3081 Allergic rhinitis due to animal (cat) (dog) hair and dander: Secondary | ICD-10-CM | POA: Diagnosis not present

## 2019-05-02 DIAGNOSIS — M6281 Muscle weakness (generalized): Secondary | ICD-10-CM | POA: Diagnosis not present

## 2019-05-09 DIAGNOSIS — R102 Pelvic and perineal pain: Secondary | ICD-10-CM | POA: Diagnosis not present

## 2019-05-09 DIAGNOSIS — M6281 Muscle weakness (generalized): Secondary | ICD-10-CM | POA: Diagnosis not present

## 2019-05-09 DIAGNOSIS — J3081 Allergic rhinitis due to animal (cat) (dog) hair and dander: Secondary | ICD-10-CM | POA: Diagnosis not present

## 2019-05-09 DIAGNOSIS — J3089 Other allergic rhinitis: Secondary | ICD-10-CM | POA: Diagnosis not present

## 2019-05-09 DIAGNOSIS — J301 Allergic rhinitis due to pollen: Secondary | ICD-10-CM | POA: Diagnosis not present

## 2019-05-09 DIAGNOSIS — M6289 Other specified disorders of muscle: Secondary | ICD-10-CM | POA: Diagnosis not present

## 2019-05-09 DIAGNOSIS — M62838 Other muscle spasm: Secondary | ICD-10-CM | POA: Diagnosis not present

## 2019-05-14 DIAGNOSIS — M62838 Other muscle spasm: Secondary | ICD-10-CM | POA: Diagnosis not present

## 2019-05-14 DIAGNOSIS — M6281 Muscle weakness (generalized): Secondary | ICD-10-CM | POA: Diagnosis not present

## 2019-05-14 DIAGNOSIS — J301 Allergic rhinitis due to pollen: Secondary | ICD-10-CM | POA: Diagnosis not present

## 2019-05-14 DIAGNOSIS — R102 Pelvic and perineal pain: Secondary | ICD-10-CM | POA: Diagnosis not present

## 2019-05-14 DIAGNOSIS — J3089 Other allergic rhinitis: Secondary | ICD-10-CM | POA: Diagnosis not present

## 2019-05-14 DIAGNOSIS — M6289 Other specified disorders of muscle: Secondary | ICD-10-CM | POA: Diagnosis not present

## 2019-05-14 DIAGNOSIS — J3081 Allergic rhinitis due to animal (cat) (dog) hair and dander: Secondary | ICD-10-CM | POA: Diagnosis not present

## 2019-05-22 DIAGNOSIS — D2272 Melanocytic nevi of left lower limb, including hip: Secondary | ICD-10-CM | POA: Diagnosis not present

## 2019-05-22 DIAGNOSIS — D2261 Melanocytic nevi of right upper limb, including shoulder: Secondary | ICD-10-CM | POA: Diagnosis not present

## 2019-05-22 DIAGNOSIS — D2271 Melanocytic nevi of right lower limb, including hip: Secondary | ICD-10-CM | POA: Diagnosis not present

## 2019-05-22 DIAGNOSIS — D2262 Melanocytic nevi of left upper limb, including shoulder: Secondary | ICD-10-CM | POA: Diagnosis not present

## 2019-05-23 DIAGNOSIS — J3089 Other allergic rhinitis: Secondary | ICD-10-CM | POA: Diagnosis not present

## 2019-05-23 DIAGNOSIS — M6289 Other specified disorders of muscle: Secondary | ICD-10-CM | POA: Diagnosis not present

## 2019-05-23 DIAGNOSIS — R102 Pelvic and perineal pain: Secondary | ICD-10-CM | POA: Diagnosis not present

## 2019-05-23 DIAGNOSIS — M6281 Muscle weakness (generalized): Secondary | ICD-10-CM | POA: Diagnosis not present

## 2019-05-23 DIAGNOSIS — J301 Allergic rhinitis due to pollen: Secondary | ICD-10-CM | POA: Diagnosis not present

## 2019-05-23 DIAGNOSIS — M62838 Other muscle spasm: Secondary | ICD-10-CM | POA: Diagnosis not present

## 2019-05-23 DIAGNOSIS — J3081 Allergic rhinitis due to animal (cat) (dog) hair and dander: Secondary | ICD-10-CM | POA: Diagnosis not present

## 2019-05-30 DIAGNOSIS — J3081 Allergic rhinitis due to animal (cat) (dog) hair and dander: Secondary | ICD-10-CM | POA: Diagnosis not present

## 2019-05-30 DIAGNOSIS — J3089 Other allergic rhinitis: Secondary | ICD-10-CM | POA: Diagnosis not present

## 2019-05-30 DIAGNOSIS — J301 Allergic rhinitis due to pollen: Secondary | ICD-10-CM | POA: Diagnosis not present

## 2019-06-07 DIAGNOSIS — J3089 Other allergic rhinitis: Secondary | ICD-10-CM | POA: Diagnosis not present

## 2019-06-07 DIAGNOSIS — J3081 Allergic rhinitis due to animal (cat) (dog) hair and dander: Secondary | ICD-10-CM | POA: Diagnosis not present

## 2019-06-07 DIAGNOSIS — J301 Allergic rhinitis due to pollen: Secondary | ICD-10-CM | POA: Diagnosis not present

## 2019-06-14 DIAGNOSIS — J3089 Other allergic rhinitis: Secondary | ICD-10-CM | POA: Diagnosis not present

## 2019-06-14 DIAGNOSIS — J301 Allergic rhinitis due to pollen: Secondary | ICD-10-CM | POA: Diagnosis not present

## 2019-06-14 DIAGNOSIS — J3081 Allergic rhinitis due to animal (cat) (dog) hair and dander: Secondary | ICD-10-CM | POA: Diagnosis not present

## 2019-06-20 DIAGNOSIS — M62838 Other muscle spasm: Secondary | ICD-10-CM | POA: Diagnosis not present

## 2019-06-20 DIAGNOSIS — R102 Pelvic and perineal pain: Secondary | ICD-10-CM | POA: Diagnosis not present

## 2019-06-20 DIAGNOSIS — M6281 Muscle weakness (generalized): Secondary | ICD-10-CM | POA: Diagnosis not present

## 2019-06-20 DIAGNOSIS — M6289 Other specified disorders of muscle: Secondary | ICD-10-CM | POA: Diagnosis not present

## 2019-06-21 DIAGNOSIS — J3081 Allergic rhinitis due to animal (cat) (dog) hair and dander: Secondary | ICD-10-CM | POA: Diagnosis not present

## 2019-06-21 DIAGNOSIS — J4530 Mild persistent asthma, uncomplicated: Secondary | ICD-10-CM | POA: Diagnosis not present

## 2019-06-21 DIAGNOSIS — J3089 Other allergic rhinitis: Secondary | ICD-10-CM | POA: Diagnosis not present

## 2019-06-21 DIAGNOSIS — H1045 Other chronic allergic conjunctivitis: Secondary | ICD-10-CM | POA: Diagnosis not present

## 2019-06-21 DIAGNOSIS — J301 Allergic rhinitis due to pollen: Secondary | ICD-10-CM | POA: Diagnosis not present

## 2019-06-28 ENCOUNTER — Encounter (HOSPITAL_COMMUNITY): Payer: Self-pay

## 2019-06-28 DIAGNOSIS — J301 Allergic rhinitis due to pollen: Secondary | ICD-10-CM | POA: Diagnosis not present

## 2019-06-28 DIAGNOSIS — J3081 Allergic rhinitis due to animal (cat) (dog) hair and dander: Secondary | ICD-10-CM | POA: Diagnosis not present

## 2019-06-28 DIAGNOSIS — J3089 Other allergic rhinitis: Secondary | ICD-10-CM | POA: Diagnosis not present

## 2019-07-02 DIAGNOSIS — M62838 Other muscle spasm: Secondary | ICD-10-CM | POA: Diagnosis not present

## 2019-07-02 DIAGNOSIS — J301 Allergic rhinitis due to pollen: Secondary | ICD-10-CM | POA: Diagnosis not present

## 2019-07-02 DIAGNOSIS — M6289 Other specified disorders of muscle: Secondary | ICD-10-CM | POA: Diagnosis not present

## 2019-07-02 DIAGNOSIS — J3089 Other allergic rhinitis: Secondary | ICD-10-CM | POA: Diagnosis not present

## 2019-07-02 DIAGNOSIS — R102 Pelvic and perineal pain: Secondary | ICD-10-CM | POA: Diagnosis not present

## 2019-07-02 DIAGNOSIS — J3081 Allergic rhinitis due to animal (cat) (dog) hair and dander: Secondary | ICD-10-CM | POA: Diagnosis not present

## 2019-07-02 DIAGNOSIS — M6281 Muscle weakness (generalized): Secondary | ICD-10-CM | POA: Diagnosis not present

## 2019-07-09 DIAGNOSIS — J3089 Other allergic rhinitis: Secondary | ICD-10-CM | POA: Diagnosis not present

## 2019-07-09 DIAGNOSIS — J301 Allergic rhinitis due to pollen: Secondary | ICD-10-CM | POA: Diagnosis not present

## 2019-07-09 DIAGNOSIS — J3081 Allergic rhinitis due to animal (cat) (dog) hair and dander: Secondary | ICD-10-CM | POA: Diagnosis not present

## 2019-07-17 DIAGNOSIS — J3081 Allergic rhinitis due to animal (cat) (dog) hair and dander: Secondary | ICD-10-CM | POA: Diagnosis not present

## 2019-07-17 DIAGNOSIS — N915 Oligomenorrhea, unspecified: Secondary | ICD-10-CM | POA: Diagnosis not present

## 2019-07-17 DIAGNOSIS — J301 Allergic rhinitis due to pollen: Secondary | ICD-10-CM | POA: Diagnosis not present

## 2019-07-17 DIAGNOSIS — J3089 Other allergic rhinitis: Secondary | ICD-10-CM | POA: Diagnosis not present

## 2019-07-17 DIAGNOSIS — Z8742 Personal history of other diseases of the female genital tract: Secondary | ICD-10-CM | POA: Diagnosis not present

## 2019-07-17 DIAGNOSIS — Z8619 Personal history of other infectious and parasitic diseases: Secondary | ICD-10-CM | POA: Diagnosis not present

## 2019-07-26 DIAGNOSIS — J3081 Allergic rhinitis due to animal (cat) (dog) hair and dander: Secondary | ICD-10-CM | POA: Diagnosis not present

## 2019-07-26 DIAGNOSIS — J301 Allergic rhinitis due to pollen: Secondary | ICD-10-CM | POA: Diagnosis not present

## 2019-07-26 DIAGNOSIS — J3089 Other allergic rhinitis: Secondary | ICD-10-CM | POA: Diagnosis not present

## 2019-07-30 DIAGNOSIS — O209 Hemorrhage in early pregnancy, unspecified: Secondary | ICD-10-CM | POA: Diagnosis not present

## 2019-07-30 DIAGNOSIS — O200 Threatened abortion: Secondary | ICD-10-CM | POA: Diagnosis not present

## 2019-08-02 DIAGNOSIS — J3081 Allergic rhinitis due to animal (cat) (dog) hair and dander: Secondary | ICD-10-CM | POA: Diagnosis not present

## 2019-08-02 DIAGNOSIS — J3089 Other allergic rhinitis: Secondary | ICD-10-CM | POA: Diagnosis not present

## 2019-08-02 DIAGNOSIS — R7989 Other specified abnormal findings of blood chemistry: Secondary | ICD-10-CM | POA: Diagnosis not present

## 2019-08-02 DIAGNOSIS — O200 Threatened abortion: Secondary | ICD-10-CM | POA: Diagnosis not present

## 2019-08-02 DIAGNOSIS — J301 Allergic rhinitis due to pollen: Secondary | ICD-10-CM | POA: Diagnosis not present

## 2019-08-06 DIAGNOSIS — Z8742 Personal history of other diseases of the female genital tract: Secondary | ICD-10-CM | POA: Diagnosis not present

## 2019-08-06 DIAGNOSIS — R7989 Other specified abnormal findings of blood chemistry: Secondary | ICD-10-CM | POA: Diagnosis not present

## 2019-08-06 DIAGNOSIS — O3680X0 Pregnancy with inconclusive fetal viability, not applicable or unspecified: Secondary | ICD-10-CM | POA: Diagnosis not present

## 2019-08-09 DIAGNOSIS — J3081 Allergic rhinitis due to animal (cat) (dog) hair and dander: Secondary | ICD-10-CM | POA: Diagnosis not present

## 2019-08-09 DIAGNOSIS — R7989 Other specified abnormal findings of blood chemistry: Secondary | ICD-10-CM | POA: Diagnosis not present

## 2019-08-09 DIAGNOSIS — J301 Allergic rhinitis due to pollen: Secondary | ICD-10-CM | POA: Diagnosis not present

## 2019-08-09 DIAGNOSIS — Z8742 Personal history of other diseases of the female genital tract: Secondary | ICD-10-CM | POA: Diagnosis not present

## 2019-08-09 DIAGNOSIS — J3089 Other allergic rhinitis: Secondary | ICD-10-CM | POA: Diagnosis not present

## 2019-08-13 DIAGNOSIS — J3089 Other allergic rhinitis: Secondary | ICD-10-CM | POA: Diagnosis not present

## 2019-08-13 DIAGNOSIS — Z8742 Personal history of other diseases of the female genital tract: Secondary | ICD-10-CM | POA: Diagnosis not present

## 2019-08-13 DIAGNOSIS — O200 Threatened abortion: Secondary | ICD-10-CM | POA: Diagnosis not present

## 2019-08-13 DIAGNOSIS — J3081 Allergic rhinitis due to animal (cat) (dog) hair and dander: Secondary | ICD-10-CM | POA: Diagnosis not present

## 2019-08-13 DIAGNOSIS — J301 Allergic rhinitis due to pollen: Secondary | ICD-10-CM | POA: Diagnosis not present

## 2019-08-21 DIAGNOSIS — O09291 Supervision of pregnancy with other poor reproductive or obstetric history, first trimester: Secondary | ICD-10-CM | POA: Diagnosis not present

## 2019-08-21 DIAGNOSIS — J3081 Allergic rhinitis due to animal (cat) (dog) hair and dander: Secondary | ICD-10-CM | POA: Diagnosis not present

## 2019-08-21 DIAGNOSIS — J301 Allergic rhinitis due to pollen: Secondary | ICD-10-CM | POA: Diagnosis not present

## 2019-08-21 DIAGNOSIS — O021 Missed abortion: Secondary | ICD-10-CM | POA: Diagnosis not present

## 2019-08-21 DIAGNOSIS — Z3A01 Less than 8 weeks gestation of pregnancy: Secondary | ICD-10-CM | POA: Diagnosis not present

## 2019-08-21 DIAGNOSIS — J3089 Other allergic rhinitis: Secondary | ICD-10-CM | POA: Diagnosis not present

## 2019-08-26 DIAGNOSIS — J301 Allergic rhinitis due to pollen: Secondary | ICD-10-CM | POA: Diagnosis not present

## 2019-08-26 DIAGNOSIS — J3081 Allergic rhinitis due to animal (cat) (dog) hair and dander: Secondary | ICD-10-CM | POA: Diagnosis not present

## 2019-08-26 DIAGNOSIS — J3089 Other allergic rhinitis: Secondary | ICD-10-CM | POA: Diagnosis not present

## 2019-09-02 DIAGNOSIS — J3081 Allergic rhinitis due to animal (cat) (dog) hair and dander: Secondary | ICD-10-CM | POA: Diagnosis not present

## 2019-09-02 DIAGNOSIS — J301 Allergic rhinitis due to pollen: Secondary | ICD-10-CM | POA: Diagnosis not present

## 2019-09-02 DIAGNOSIS — J3089 Other allergic rhinitis: Secondary | ICD-10-CM | POA: Diagnosis not present

## 2019-09-03 DIAGNOSIS — O021 Missed abortion: Secondary | ICD-10-CM | POA: Diagnosis not present

## 2019-09-03 DIAGNOSIS — Z01818 Encounter for other preprocedural examination: Secondary | ICD-10-CM | POA: Diagnosis not present

## 2019-09-04 ENCOUNTER — Emergency Department (HOSPITAL_COMMUNITY): Payer: BC Managed Care – PPO

## 2019-09-04 ENCOUNTER — Ambulatory Visit (HOSPITAL_COMMUNITY)
Admission: EM | Admit: 2019-09-04 | Discharge: 2019-09-04 | Disposition: A | Discharge status: Home / Self Care | Payer: BC Managed Care – PPO | Attending: Obstetrics and Gynecology | Admitting: Obstetrics and Gynecology

## 2019-09-04 ENCOUNTER — Emergency Department (HOSPITAL_COMMUNITY): Payer: BC Managed Care – PPO | Admitting: Certified Registered"

## 2019-09-04 ENCOUNTER — Encounter (HOSPITAL_COMMUNITY): Payer: Self-pay | Admitting: Emergency Medicine

## 2019-09-04 ENCOUNTER — Encounter (HOSPITAL_COMMUNITY)
Admission: EM | Disposition: A | Discharge status: Home / Self Care | Payer: Self-pay | Source: Home / Self Care | Attending: Emergency Medicine

## 2019-09-04 DIAGNOSIS — D649 Anemia, unspecified: Secondary | ICD-10-CM | POA: Insufficient documentation

## 2019-09-04 DIAGNOSIS — O99511 Diseases of the respiratory system complicating pregnancy, first trimester: Secondary | ICD-10-CM | POA: Diagnosis not present

## 2019-09-04 DIAGNOSIS — J45909 Unspecified asthma, uncomplicated: Secondary | ICD-10-CM | POA: Diagnosis not present

## 2019-09-04 DIAGNOSIS — Z7951 Long term (current) use of inhaled steroids: Secondary | ICD-10-CM | POA: Insufficient documentation

## 2019-09-04 DIAGNOSIS — Z79899 Other long term (current) drug therapy: Secondary | ICD-10-CM | POA: Diagnosis not present

## 2019-09-04 DIAGNOSIS — Z9889 Other specified postprocedural states: Secondary | ICD-10-CM

## 2019-09-04 DIAGNOSIS — Z03818 Encounter for observation for suspected exposure to other biological agents ruled out: Secondary | ICD-10-CM | POA: Diagnosis not present

## 2019-09-04 DIAGNOSIS — O039 Complete or unspecified spontaneous abortion without complication: Secondary | ICD-10-CM | POA: Diagnosis not present

## 2019-09-04 DIAGNOSIS — Z20822 Contact with and (suspected) exposure to covid-19: Secondary | ICD-10-CM | POA: Diagnosis not present

## 2019-09-04 DIAGNOSIS — O021 Missed abortion: Secondary | ICD-10-CM | POA: Diagnosis not present

## 2019-09-04 DIAGNOSIS — O209 Hemorrhage in early pregnancy, unspecified: Secondary | ICD-10-CM | POA: Diagnosis not present

## 2019-09-04 DIAGNOSIS — Z3A01 Less than 8 weeks gestation of pregnancy: Secondary | ICD-10-CM | POA: Diagnosis not present

## 2019-09-04 DIAGNOSIS — O034 Incomplete spontaneous abortion without complication: Secondary | ICD-10-CM

## 2019-09-04 HISTORY — PX: DILATION AND CURETTAGE OF UTERUS: SHX78

## 2019-09-04 LAB — RESPIRATORY PANEL BY RT PCR (FLU A&B, COVID)
Influenza A by PCR: NEGATIVE
Influenza B by PCR: NEGATIVE
SARS Coronavirus 2 by RT PCR: NEGATIVE

## 2019-09-04 LAB — CBC
HCT: 28 % — ABNORMAL LOW (ref 36.0–46.0)
Hemoglobin: 9.2 g/dL — ABNORMAL LOW (ref 12.0–15.0)
MCH: 30.9 pg (ref 26.0–34.0)
MCHC: 32.9 g/dL (ref 30.0–36.0)
MCV: 94 fL (ref 80.0–100.0)
Platelets: 243 10*3/uL (ref 150–400)
RBC: 2.98 MIL/uL — ABNORMAL LOW (ref 3.87–5.11)
RDW: 12.6 % (ref 11.5–15.5)
WBC: 10.3 10*3/uL (ref 4.0–10.5)
nRBC: 0 % (ref 0.0–0.2)

## 2019-09-04 LAB — CBC WITH DIFFERENTIAL/PLATELET
Abs Immature Granulocytes: 0.05 10*3/uL (ref 0.00–0.07)
Basophils Absolute: 0.1 10*3/uL (ref 0.0–0.1)
Basophils Relative: 1 %
Eosinophils Absolute: 0.1 10*3/uL (ref 0.0–0.5)
Eosinophils Relative: 1 %
HCT: 38.2 % (ref 36.0–46.0)
Hemoglobin: 12.1 g/dL (ref 12.0–15.0)
Immature Granulocytes: 0 %
Lymphocytes Relative: 27 %
Lymphs Abs: 3.6 10*3/uL (ref 0.7–4.0)
MCH: 30.3 pg (ref 26.0–34.0)
MCHC: 31.7 g/dL (ref 30.0–36.0)
MCV: 95.5 fL (ref 80.0–100.0)
Monocytes Absolute: 1 10*3/uL (ref 0.1–1.0)
Monocytes Relative: 8 %
Neutro Abs: 8.3 10*3/uL — ABNORMAL HIGH (ref 1.7–7.7)
Neutrophils Relative %: 63 %
Platelets: 352 10*3/uL (ref 150–400)
RBC: 4 MIL/uL (ref 3.87–5.11)
RDW: 12.6 % (ref 11.5–15.5)
WBC: 13.1 10*3/uL — ABNORMAL HIGH (ref 4.0–10.5)
nRBC: 0 % (ref 0.0–0.2)

## 2019-09-04 LAB — BASIC METABOLIC PANEL
Anion gap: 8 (ref 5–15)
BUN: 14 mg/dL (ref 6–20)
CO2: 23 mmol/L (ref 22–32)
Calcium: 8.9 mg/dL (ref 8.9–10.3)
Chloride: 106 mmol/L (ref 98–111)
Creatinine, Ser: 0.79 mg/dL (ref 0.44–1.00)
GFR calc Af Amer: >60 mL/min (ref >60)
GFR calc non Af Amer: >60 mL/min (ref >60)
Glucose, Bld: 127 mg/dL — ABNORMAL HIGH (ref 70–99)
Potassium: 3.8 mmol/L (ref 3.5–5.1)
Sodium: 137 mmol/L (ref 135–145)

## 2019-09-04 LAB — ABO/RH: ABO/RH(D): A POS

## 2019-09-04 LAB — TYPE AND SCREEN
ABO/RH(D): A POS
Antibody Screen: NEGATIVE

## 2019-09-04 LAB — HCG, QUANTITATIVE, PREGNANCY: hCG, Beta Chain, Quant, S: 2974 m[IU]/mL — ABNORMAL HIGH (ref <5)

## 2019-09-04 SURGERY — DILATION AND CURETTAGE
Anesthesia: General | Site: Abdomen

## 2019-09-04 MED ORDER — MIDAZOLAM HCL 2 MG/2ML IJ SOLN
INTRAMUSCULAR | Status: DC | PRN
  Administered 2019-09-04: 2 mg via INTRAVENOUS

## 2019-09-04 MED ORDER — PROPOFOL 10 MG/ML IV BOLUS
INTRAVENOUS | Status: AC
  Filled 2019-09-04: qty 20

## 2019-09-04 MED ORDER — SILVER NITRATE-POT NITRATE 75-25 % EX MISC
CUTANEOUS | Status: AC
  Filled 2019-09-04: qty 10

## 2019-09-04 MED ORDER — SODIUM CHLORIDE 0.9 % IV BOLUS
1000.0000 mL | Freq: Once | INTRAVENOUS | Status: AC
  Administered 2019-09-04: 1000 mL via INTRAVENOUS

## 2019-09-04 MED ORDER — ONDANSETRON HCL 4 MG/2ML IJ SOLN
INTRAMUSCULAR | Status: AC
  Filled 2019-09-04: qty 2

## 2019-09-04 MED ORDER — PHENYLEPHRINE 40 MCG/ML (10ML) SYRINGE FOR IV PUSH (FOR BLOOD PRESSURE SUPPORT)
PREFILLED_SYRINGE | INTRAVENOUS | Status: DC | PRN
  Administered 2019-09-04: 120 ug via INTRAVENOUS
  Administered 2019-09-04 (×2): 80 ug via INTRAVENOUS
  Administered 2019-09-04: 120 ug via INTRAVENOUS

## 2019-09-04 MED ORDER — FENTANYL CITRATE (PF) 100 MCG/2ML IJ SOLN
INTRAMUSCULAR | Status: AC
  Filled 2019-09-04: qty 2

## 2019-09-04 MED ORDER — FENTANYL CITRATE (PF) 100 MCG/2ML IJ SOLN
100.0000 ug | Freq: Once | INTRAMUSCULAR | Status: AC
  Administered 2019-09-04: 100 ug via INTRAVENOUS
  Filled 2019-09-04: qty 2

## 2019-09-04 MED ORDER — SODIUM CHLORIDE 0.9 % IV SOLN
100.0000 mg | Freq: Once | INTRAVENOUS | Status: AC
  Administered 2019-09-04: 100 mg via INTRAVENOUS
  Filled 2019-09-04: qty 100

## 2019-09-04 MED ORDER — ACETAMINOPHEN 500 MG PO TABS: 500.0000 mg | ORAL_TABLET | Freq: Four times a day (QID) | ORAL | 0 refills | Status: AC | PRN

## 2019-09-04 MED ORDER — PROMETHAZINE HCL 25 MG/ML IJ SOLN: 6.2500 mg | INTRAMUSCULAR | Status: DC | PRN

## 2019-09-04 MED ORDER — HYDROCODONE-ACETAMINOPHEN 5-325 MG PO TABS: 1.0000 | ORAL_TABLET | Freq: Four times a day (QID) | ORAL | 0 refills | Status: AC | PRN

## 2019-09-04 MED ORDER — ACETAMINOPHEN 10 MG/ML IV SOLN: 1000.0000 mg | Freq: Once | INTRAVENOUS | Status: DC | PRN

## 2019-09-04 MED ORDER — OXYCODONE-ACETAMINOPHEN 5-325 MG PO TABS: 1.0000 | ORAL_TABLET | Freq: Four times a day (QID) | ORAL | 0 refills | Status: AC | PRN

## 2019-09-04 MED ORDER — DEXAMETHASONE SODIUM PHOSPHATE 10 MG/ML IJ SOLN
INTRAMUSCULAR | Status: DC | PRN
  Administered 2019-09-04: 8 mg via INTRAVENOUS

## 2019-09-04 MED ORDER — FENTANYL CITRATE (PF) 100 MCG/2ML IJ SOLN
INTRAMUSCULAR | Status: DC | PRN
  Administered 2019-09-04: 50 ug via INTRAVENOUS

## 2019-09-04 MED ORDER — DEXAMETHASONE SODIUM PHOSPHATE 10 MG/ML IJ SOLN
INTRAMUSCULAR | Status: AC
  Filled 2019-09-04: qty 1

## 2019-09-04 MED ORDER — METHYLERGONOVINE MALEATE 0.2 MG/ML IJ SOLN
0.2000 mg | Freq: Once | INTRAMUSCULAR | Status: AC
  Administered 2019-09-04: 0.2 mg via INTRAMUSCULAR
  Filled 2019-09-04: qty 1

## 2019-09-04 MED ORDER — LACTATED RINGERS IV SOLN: INTRAVENOUS | Status: DC

## 2019-09-04 MED ORDER — MIDAZOLAM HCL 2 MG/2ML IJ SOLN
INTRAMUSCULAR | Status: AC
  Filled 2019-09-04: qty 2

## 2019-09-04 MED ORDER — ONDANSETRON HCL 4 MG/2ML IJ SOLN
4.0000 mg | Freq: Once | INTRAMUSCULAR | Status: AC
  Administered 2019-09-04: 02:00:00 4 mg via INTRAVENOUS
  Filled 2019-09-04: qty 2

## 2019-09-04 MED ORDER — LIDOCAINE 2% (20 MG/ML) 5 ML SYRINGE
INTRAMUSCULAR | Status: AC
  Filled 2019-09-04: qty 5

## 2019-09-04 MED ORDER — ONDANSETRON HCL 4 MG/2ML IJ SOLN
INTRAMUSCULAR | Status: DC | PRN
  Administered 2019-09-04: 4 mg via INTRAVENOUS

## 2019-09-04 MED ORDER — PHENYLEPHRINE 40 MCG/ML (10ML) SYRINGE FOR IV PUSH (FOR BLOOD PRESSURE SUPPORT)
PREFILLED_SYRINGE | INTRAVENOUS | Status: AC
  Filled 2019-09-04: qty 10

## 2019-09-04 MED ORDER — LIDOCAINE 2% (20 MG/ML) 5 ML SYRINGE
INTRAMUSCULAR | Status: DC | PRN
  Administered 2019-09-04: 50 mg via INTRAVENOUS

## 2019-09-04 MED ORDER — 0.9 % SODIUM CHLORIDE (POUR BTL) OPTIME
TOPICAL | Status: DC | PRN
  Administered 2019-09-04: 1000 mL

## 2019-09-04 MED ORDER — PROPOFOL 10 MG/ML IV BOLUS
INTRAVENOUS | Status: DC | PRN
  Administered 2019-09-04: 200 mg via INTRAVENOUS

## 2019-09-04 MED ORDER — IBUPROFEN 600 MG PO TABS: ORAL_TABLET | ORAL | 3 refills | Status: AC

## 2019-09-04 MED ORDER — LIDOCAINE 1% INJECTION FOR CIRCUMCISION
INJECTION | INTRAVENOUS | Status: DC | PRN
  Administered 2019-09-04: 20 mL

## 2019-09-04 MED ORDER — FENTANYL CITRATE (PF) 100 MCG/2ML IJ SOLN
100.0000 ug | Freq: Once | INTRAMUSCULAR | Status: AC
  Administered 2019-09-04: 02:00:00 100 ug via INTRAVENOUS
  Filled 2019-09-04: qty 2

## 2019-09-04 MED ORDER — FENTANYL CITRATE (PF) 100 MCG/2ML IJ SOLN
25.0000 ug | INTRAMUSCULAR | Status: DC | PRN
  Administered 2019-09-04 (×2): 50 ug via INTRAVENOUS

## 2019-09-04 MED ORDER — METHYLERGONOVINE MALEATE 0.2 MG/ML IJ SOLN: 0.2000 mg | Freq: Once | INTRAMUSCULAR | Status: DC

## 2019-09-04 MED ORDER — LIDOCAINE HCL (PF) 1 % IJ SOLN
INTRAMUSCULAR | Status: AC
  Filled 2019-09-04: qty 30

## 2019-09-04 SURGICAL SUPPLY — 4 items
COVER SURGICAL LIGHT HANDLE (MISCELLANEOUS) ×3 IMPLANT
COVER WAND RF STERILE (DRAPES) IMPLANT
KIT TURNOVER KIT A (KITS) IMPLANT
PENCIL SMOKE EVACUATOR (MISCELLANEOUS) IMPLANT

## 2019-09-04 NOTE — Transfer of Care (Signed)
Immediate Anesthesia Transfer of Care Note  Patient: Sherry Fuentes  Procedure(s) Performed: Dilation and curettage (N/A Abdomen)  Patient Location: PACU  Anesthesia Type:General  Level of Consciousness: awake, alert  and oriented  Airway & Oxygen Therapy: Patient Spontanous Breathing and Patient connected to face mask oxygen  Post-op Assessment: Report given to RN, Post -op Vital signs reviewed and stable and Patient moving all extremities X 4  Post vital signs: Reviewed and stable  Last Vitals:  Vitals Value Taken Time  BP 130/63 09/04/19 1109  Temp 36.5 C 09/04/19 1109  Pulse 85 09/04/19 1111  Resp 28 09/04/19 1111  SpO2 100 % 09/04/19 1111  Vitals shown include unvalidated device data.  Last Pain:  Vitals:   09/04/19 0939  TempSrc:   PainSc: 6          Complications: No apparent anesthesia complications

## 2019-09-04 NOTE — ED Triage Notes (Signed)
Pt lost a pregnancy at 10 weeks (2 weeks ago).  Pt did not release any of the POC and went back to her doctor today and was given cytotec in an attempt to induce the expulsion of POC.  Pt took cytotec at 11 am and began having cramps and bleeding.  Pt concerned with the amount of blood.  Reports golf Labra sized clots.  Pt c/o weakness, dizziness, chills.  Called on call nurse and was told to come into the hospital.

## 2019-09-04 NOTE — Anesthesia Postprocedure Evaluation (Signed)
Anesthesia Post Note  Patient: Sherry Fuentes  Procedure(s) Performed: Dilation and curettage (N/A Abdomen)     Patient location during evaluation: PACU Anesthesia Type: General Level of consciousness: awake and alert Pain management: pain level controlled Vital Signs Assessment: post-procedure vital signs reviewed and stable Respiratory status: spontaneous breathing, nonlabored ventilation, respiratory function stable and patient connected to nasal cannula oxygen Cardiovascular status: blood pressure returned to baseline and stable Postop Assessment: no apparent nausea or vomiting Anesthetic complications: no    Last Vitals:  Vitals:   09/04/19 1145 09/04/19 1153  BP: 112/68 118/80  Pulse: 79 79  Resp: 17   Temp: 36.9 C 36.9 C  SpO2: 99% 99%    Last Pain:  Vitals:   09/04/19 1153  TempSrc:   PainSc: 2                  Kennieth Rad

## 2019-09-04 NOTE — ED Notes (Signed)
Surgeon and OR RN at patient bedside.

## 2019-09-04 NOTE — ED Notes (Signed)
BSC placed in room. Patient stated her husband will assist.

## 2019-09-04 NOTE — Anesthesia Procedure Notes (Signed)
Procedure Name: LMA Insertion Date/Time: 09/04/2019 10:16 AM Performed by: Nelle Don, CRNA Pre-anesthesia Checklist: Patient identified, Emergency Drugs available, Suction available and Patient being monitored Patient Re-evaluated:Patient Re-evaluated prior to induction Oxygen Delivery Method: Circle system utilized Preoxygenation: Pre-oxygenation with 100% oxygen Induction Type: IV induction LMA: LMA inserted LMA Size: 4.0 Dental Injury: Teeth and Oropharynx as per pre-operative assessment

## 2019-09-04 NOTE — Anesthesia Preprocedure Evaluation (Signed)
Anesthesia Evaluation  Patient identified by MRN, date of birth, ID band Patient awake    Reviewed: Allergy & Precautions, NPO status , Patient's Chart, lab work & pertinent test results  Airway Mallampati: II  TM Distance: >3 FB Neck ROM: Full    Dental no notable dental hx.    Pulmonary asthma ,    Pulmonary exam normal breath sounds clear to auscultation       Cardiovascular negative cardio ROS Normal cardiovascular exam Rhythm:Regular Rate:Normal     Neuro/Psych negative neurological ROS  negative psych ROS   GI/Hepatic Neg liver ROS, GERD  ,  Endo/Other  negative endocrine ROS  Renal/GU negative Renal ROS  negative genitourinary   Musculoskeletal negative musculoskeletal ROS (+)   Abdominal   Peds negative pediatric ROS (+)  Hematology negative hematology ROS (+)   Anesthesia Other Findings   Reproductive/Obstetrics negative OB ROS                             Anesthesia Physical Anesthesia Plan  ASA: II  Anesthesia Plan: General   Post-op Pain Management:    Induction: Intravenous and Rapid sequence  PONV Risk Score and Plan: 3 and Ondansetron, Dexamethasone, Treatment may vary due to age or medical condition and Midazolam  Airway Management Planned: Oral ETT  Additional Equipment:   Intra-op Plan:   Post-operative Plan: Extubation in OR  Informed Consent: I have reviewed the patients History and Physical, chart, labs and discussed the procedure including the risks, benefits and alternatives for the proposed anesthesia with the patient or authorized representative who has indicated his/her understanding and acceptance.     Dental advisory given  Plan Discussed with: CRNA and Surgeon  Anesthesia Plan Comments:         Anesthesia Quick Evaluation

## 2019-09-04 NOTE — Discharge Instructions (Signed)
   We will discuss your surgery once again in detail at your post-op visit in two to four weeks. If you haven't already done so, please call to make your appointment as soon as possible.  Dilation and Curettage or Vacuum Curettage, Care After These instructions give you information on caring for yourself after your procedure. Your doctor may also give you more specific instructions. Call your doctor if you have any problems or questions after your procedure. HOME CARE Do not drive for 24 hours. Wait 1 week before doing any activities that wear you out. Do not stand for a long time. Limit stair climbing to once or twice a day. Rest often. Continue with your usual diet. Drink enough fluids to keep your pee (urine) clear or pale yellow. If you have a hard time pooping (constipation), you may: Take a medicine to help you go poop (laxative) as told by your doctor. Eat more fruit and bran. Drink more fluids. Take showers, not baths, for as long as told by your doctor. Do not swim or use a hot tub until your doctor says it is okay. Have someone with you for 1day after the procedure. Do not douche, use tampons, or have sex (intercourse) until seen by your doctor Only take medicines as told by your doctor. Do not take aspirin. It can cause bleeding. Keep all doctor visits. GET HELP IF: You have cramps or pain not helped by medicine. You have new pain in the belly (abdomen). You have a bad smelling fluid coming from your vagina. You have a rash. You have problems with any medicine. GET HELP RIGHT AWAY IF:  You start to bleed more than a regular period. You have a fever. You have chest pain. You have trouble breathing. You feel dizzy or feel like passing out (fainting). You pass out. You have pain in the tops of your shoulders. You have vaginal bleeding with or without clumps of blood (blood clots). MAKE SURE YOU: Understand these instructions. Will watch your condition. Will get help  right away if you are not doing well or get worse. Document Released: 05/17/2008 Document Revised: 08/13/2013 Document Reviewed: 03/07/2013 Exit[employer] Patient Information 2015 [employer]. This information is not intended to replace advice given to you by your health care provider. Make sure you discuss any questions you have with your health care provider.   

## 2019-09-04 NOTE — ED Notes (Signed)
Patient reports feeling dizzy and light headed upon trying to stand.

## 2019-09-04 NOTE — ED Provider Notes (Addendum)
[facility] DEPT [facility] Provider Note: Georgena Spurling, MD, FACEP  CSN: [id number] MRN: [id number] ARRIVAL: 09/04/19 at [place]  Abdominal Pain and Vaginal Bleeding   HISTORY OF PRESENT ILLNESS  09/04/19 1:37 AM Sherry Fuentes is a 34 y.o. female who suffered a missed abortion about 10 days ago.  She is scheduled for a D&C tomorrow.  She was given Cytotec by her OB/GYN yesterday in an attempt to induce expulsion of products of conception.  She took the Cytotec at 11 AM and began having cramps and bleeding.  The patient states she had a significant amount of blood including golf Cotta sized clots.  Bleeding continues at this time.  She is complaining of weakness, lightheadedness and chills.  On arrival she was noted to have a pulse rate of 124.  She is having associated pelvic pain which she rates as an 8 out of 10, cramping in nature.   Her OB/GYN is Dr. Yolonda Kida at [facility] in [place].  Past Medical History:  Diagnosis Date  . Asthma   . Frequency of urination   . GERD (gastroesophageal reflux disease)   . Interstitial cystitis   . Nocturia   . Pelvic pain in female   . Urgency of urination     Past Surgical History:  Procedure Laterality Date  . CYSTO WITH HYDRODISTENSION  08/02/2012   Procedure: CYSTOSCOPY/HYDRODISTENSION;  Surgeon: Reece Packer, MD;  Location: [facility];  Service: Urology;  Laterality: N/A;  Cystoscopy/Hydrodistension Instillation of Marcaine and Pyridum  . LAPAROSCOPY N/A 11/16/2015   Procedure: LAPAROSCOPY DIAGNOSTIC; LYSIS OF ADHESIONS;  Surgeon: Molli Posey, MD;  Location: [facility];  Service: Gynecology;  Laterality: N/A;  . NEGATIVE SLEEP STUDY  2015  . REMOVAL RIGHT WRIST CYST  2011  . TONSILLECTOMY  2015  . WISDOM TOOTH EXTRACTION  AGE 2    No family history on file.  Social History   Tobacco Use  . Smoking status: Never Smoker  . Smokeless tobacco: Never  Used  Substance Use Topics  . Alcohol use: Not Currently    Comment: RARE  . Drug use: No    Prior to Admission medications   Medication Sig Start Date End Date Taking? Authorizing Provider  Ascorbic Acid (VITAMIN C) 1000 MG tablet Take 1,000 mg by mouth daily.   Yes [provider]  azelastine (ASTELIN) 0.1 % nasal spray Place 2 sprays into both nostrils 2 (two) times daily. Use in each nostril as directed   Yes [provider]  azelastine (OPTIVAR) 0.05 % ophthalmic solution Place 1 drop into both eyes 2 (two) times daily.   Yes [provider]  BREO ELLIPTA 100-25 MCG/INH AEPB Inhale 1 puff into the lungs daily.  05/29/17  Yes [provider]  Cholecalciferol (VITAMIN D-3 PO) Take 5,000 Units by mouth daily.    Yes [provider]  EPINEPHrine 0.3 mg/0.3 mL IJ SOAJ injection Inject 0.3 mg into the muscle 3 times/day as needed-between meals & bedtime for allergies.   Yes [provider]  fluticasone (FLONASE) 50 MCG/ACT nasal spray Place 1 spray into both nostrils daily.   Yes [provider]  IODINE, KELP, PO Take 232 mcg by mouth daily.   Yes [provider]  levocetirizine (XYZAL) 5 MG tablet Take 5 mg by mouth every evening.   Yes [provider]  montelukast (SINGULAIR) 10 MG tablet Take 10 mg by mouth at bedtime.  Yes [provider]  Omega 3 1000 MG CAPS Take 1,000 mg by mouth daily.   Yes [provider]  PROAIR HFA 108 (90 Base) MCG/ACT inhaler Inhale 2 puffs into the lungs every 6 (six) hours as needed for wheezing or shortness of breath.  10/07/15  Yes [provider]  Probiotic Product (PROBIOTIC PO) Take 1 tablet by mouth daily.   Yes [provider]  vitamin B-12 (CYANOCOBALAMIN) 1000 MCG tablet Take 1,000 mcg by mouth every 3 (three) days.    Yes [provider]  acetaminophen (TYLENOL) 500 MG tablet Take 1 tablet (500 mg total) by mouth every 6 (six)  hours as needed. 09/04/19   [place] Bing, MD  HYDROcodone-acetaminophen (NORCO) 5-325 MG tablet Take 1-2 tablets by mouth every 6 (six) hours as needed (for pain). 09/04/19   Keirstyn Aydt, Jonny Ruiz, MD  ibuprofen (ADVIL) 600 MG tablet One tab po q6h x 48 hours and then one tab po q6h prn pain 09/04/19   [place] Bing, MD  oxyCODONE-acetaminophen (PERCOCET/ROXICET) 5-325 MG tablet Take 1 tablet by mouth every 6 (six) hours as needed. 09/04/19   [place] Bing, MD    Allergies Patient has no known allergies.   REVIEW OF SYSTEMS  Negative except as noted here or in the History of Present Illness.   PHYSICAL EXAMINATION  Initial Vital Signs Blood pressure 125/90, pulse (!) 124, temperature 98.2 F (36.8 C), temperature source Oral, resp. rate 18, height 5\' 7"  (1.702 m), weight 87.1 kg, SpO2 100 %, unknown if currently breastfeeding.  Examination General: Well-developed, well-nourished female in no acute distress; appearance consistent with age of record HENT: normocephalic; atraumatic Eyes: pupils equal, round and reactive to light; extraocular muscles intact Neck: supple Heart: regular rate and rhythm; tachycardic Lungs: clear to auscultation bilaterally Abdomen: soft; nondistended; suprapubic tenderness; bowel sounds present GU: Normal external genitalia; heavy vaginal bleeding with clots; cervix not visualized due to bleeding, cervical os feels closed on digital examination Extremities: No deformity; full range of motion; pulses normal Neurologic: Awake, alert and oriented; motor function intact in all extremities and symmetric; no facial droop Skin: Warm and dry Psychiatric: Normal mood and affect   RESULTS  Summary of this visit's results, reviewed and interpreted by myself:   EKG Interpretation  Date/Time:    Ventricular Rate:    PR Interval:    QRS Duration:   QT Interval:    QTC Calculation:   R Axis:     Text Interpretation:        Laboratory Studies: Results for  orders placed or performed during the hospital encounter of 09/04/19 (from the past 24 hour(s))  CBC with Differential     Status: Abnormal   Collection Time: 09/04/19  1:42 AM  Result Value Ref Range   WBC 13.1 (H) 4.0 - 10.5 K/uL   RBC 4.00 3.87 - 5.11 MIL/uL   Hemoglobin 12.1 12.0 - 15.0 g/dL   HCT 09/06/19 16.1 - 09.6 %   MCV 95.5 80.0 - 100.0 fL   MCH 30.3 26.0 - 34.0 pg   MCHC 31.7 30.0 - 36.0 g/dL   RDW 04.5 40.9 - 81.1 %   Platelets 352 150 - 400 K/uL   nRBC 0.0 0.0 - 0.2 %   Neutrophils Relative % 63 %   Neutro Abs 8.3 (H) 1.7 - 7.7 K/uL   Lymphocytes Relative 27 %   Lymphs Abs 3.6 0.7 - 4.0 K/uL   Monocytes Relative 8 %   Monocytes Absolute 1.0 0.1 -  1.0 K/uL   Eosinophils Relative 1 %   Eosinophils Absolute 0.1 0.0 - 0.5 K/uL   Basophils Relative 1 %   Basophils Absolute 0.1 0.0 - 0.1 K/uL   Immature Granulocytes 0 %   Abs Immature Granulocytes 0.05 0.00 - 0.07 K/uL  Basic metabolic panel     Status: Abnormal   Collection Time: 09/04/19  1:42 AM  Result Value Ref Range   Sodium 137 135 - 145 mmol/L   Potassium 3.8 3.5 - 5.1 mmol/L   Chloride 106 98 - 111 mmol/L   CO2 23 22 - 32 mmol/L   Glucose, Bld 127 (H) 70 - 99 mg/dL   BUN 14 6 - 20 mg/dL   Creatinine, Ser 3.71 0.44 - 1.00 mg/dL   Calcium 8.9 8.9 - 06.2 mg/dL   GFR calc non Af Amer >60 >60 mL/min   GFR calc Af Amer >60 >60 mL/min   Anion gap 8 5 - 15  hCG Quant     Status: Abnormal   Collection Time: 09/04/19  1:43 AM  Result Value Ref Range   hCG, Beta Chain, Quant, S 2,974 (H) <5 mIU/mL  Type and screen     Status: None   Collection Time: 09/04/19  1:53 AM  Result Value Ref Range   ABO/RH(D) A POS    Antibody Screen NEG    Sample Expiration      09/07/2019,2359 Performed at [facility], [address] [place], [place] [address]   ABO/Rh     Status: None   Collection Time: 09/04/19  3:00 AM  Result Value Ref Range   ABO/RH(D)      A POS Performed at [facility], [address], [place], [place] [address]   CBC     Status: Abnormal   Collection Time: 09/04/19  7:30 AM  Result Value Ref Range   WBC 10.3 4.0 - 10.5 K/uL   RBC 2.98 (L) 3.87 - 5.11 MIL/uL   Hemoglobin 9.2 (L) 12.0 - 15.0 g/dL   HCT 35.0 (L) 09.3 - 81.8 %   MCV 94.0 80.0 - 100.0 fL   MCH 30.9 26.0 - 34.0 pg   MCHC 32.9 30.0 - 36.0 g/dL   RDW 29.9 37.1 - 69.6 %   Platelets 243 150 - 400 K/uL   nRBC 0.0 0.0 - 0.2 %  Respiratory Panel by RT PCR (Flu A&B, Covid) - Nasopharyngeal Swab     Status: None   Collection Time: 09/04/19  8:17 AM   Specimen: Nasopharyngeal Swab  Result Value Ref Range   SARS Coronavirus 2 by RT PCR NEGATIVE NEGATIVE   Influenza A by PCR NEGATIVE NEGATIVE   Influenza B by PCR NEGATIVE NEGATIVE   Imaging Studies: US OB LESS THAN 14 WEEKS WITH OB TRANSVAGINAL  Result Date: 09/04/2019 CLINICAL DATA:  Bleeding, miscarriage EXAM: OBSTETRIC <14 WK [place] AND TRANSVAGINAL OB US TECHNIQUE: Both transabdominal and transvaginal ultrasound examinations were performed for complete evaluation of the gestation as well as the maternal uterus, adnexal regions, and pelvic cul-de-sac. Transvaginal technique was performed to assess early pregnancy. COMPARISON:  None. FINDINGS: Intrauterine gestational sac: Probable gestational sac seen near the lower uterine segment/cervix. Yolk sac:  Not Visualized. Embryo:  Not Visualized. MSD: 8.5 mm 5 w   5 d Subchorionic hemorrhage:  None visualized. Maternal uterus/adnexae: Within the left ovary there is a anechoic cyst. The right ovary is not well seen. IMPRESSION: Small probable gestational sac seen near the lower  uterine segment/cervix. These findings are suspicious but not yet definitive for failed pregnancy. Recommend follow-up US in 10-14 days for definitive diagnosis. This recommendation follows SRU consensus guidelines: Diagnostic Criteria for Nonviable Pregnancy Early in the First Trimester. Malva Limes Med 2013; 638:9373-42.  Electronically Signed   By: Jonna Clark M.D.   On: 09/04/2019 02:41    ED COURSE and MDM  Nursing notes, initial and subsequent vitals signs, including pulse oximetry, reviewed and interpreted by myself.  Vitals:   09/04/19 1115 09/04/19 1130 09/04/19 1145 09/04/19 1153  BP: 119/78 117/73 112/68 118/80  Pulse: 97 88 79 79  Resp: 14 (!) 21 17   Temp:   98.4 F (36.9 C) 98.4 F (36.9 C)  TempSrc:      SpO2: 100% 100% 99% 99%  Weight:      Height:       Medications  sodium chloride 0.9 % bolus 1,000 mL (0 mLs Intravenous Stopped 09/04/19 0255)  ondansetron (ZOFRAN) injection 4 mg (4 mg Intravenous Given 09/04/19 0215)  fentaNYL (SUBLIMAZE) injection 100 mcg (100 mcg Intravenous Given 09/04/19 0215)  fentaNYL (SUBLIMAZE) injection 100 mcg (100 mcg Intravenous Given 09/04/19 0523)  sodium chloride 0.9 % bolus 1,000 mL (0 mLs Intravenous Stopped 09/04/19 0732)  doxycycline (VIBRAMYCIN) 100 mg in sodium chloride 0.9 % 250 mL IVPB ( Intravenous Anesthesia Volume Adjustment 09/04/19 1112)  methylergonovine (METHERGINE) injection 0.2 mg (0.2 mg Intramuscular Given 09/04/19 1121)   5:12 AM Patient's tachycardia resolved after IV fluid bolus.  Her blood counts are within normal limits so blood transfusion is not indicated.  The patient and her husband are comfortable going home and will contact Dr. Kathlen Brunswick later this morning.  As noted above she is scheduled for [facility] tomorrow.  She was advised to return should bleeding become heavier or other symptoms worsen.  6:17 AM Patient was too weak to stand at discharge.  She is receiving an additional liter of fluid.  Will reassess CBC after infusion.   PROCEDURES  Procedures   ED DIAGNOSES     ICD-10-CM   1. Retained products of conception after miscarriage  O03.4 US OB LESS THAN 14 WEEKS WITH OB TRANSVAGINAL    US OB LESS THAN 14 WEEKS WITH OB TRANSVAGINAL  2. Symptomatic anemia  D64.9   3. Status post dilatation and curettage  Z98.890 Discharge  patient       Paula Libra, MD 09/04/19 8768    Paula Libra, MD 09/04/19 2230

## 2019-09-04 NOTE — Op Note (Signed)
Operative Note   09/04/2019  PRE-OP DIAGNOSIS: Missed abortion with retained gestational sac.    POST-OP DIAGNOSIS: Same  SURGEON: Surgeon(s) and Role:    Lesage Bing, MD - Primary  ASSISTANT: None  PROCEDURE:  Suction dilation and curettage  ANESTHESIA: Monitor Anesthesia Care and paracervical block  ESTIMATED BLOOD LOSS:  DRAINS: None  TOTAL IV FLUIDS: per anesthesia note  SPECIMENS: products of conception to pathology  VTE PROPHYLAXIS: SCDs to the bilateral lower extremities  ANTIBIOTICS: Doxycycline 100mg  IV x 1 pre op  COMPLICATIONS: none  DISPOSITION: PACU - hemodynamically stable.  CONDITION: stable  BLOOD TYPE: A POS Performed at [facility], [address] [place], [place] Rhogam given: not applicable  FINDINGS: Exam under anesthesia revealed 8 week sized uterus with no masses and bilateral adnexa without masses or fullness. Necrotic appearing products of conception were seen, with gritty texture in all four quadrants.   PROCEDURE IN DETAIL:  After informed consent was obtained, the patient was taken to the operating room where anesthesia was obtained without difficulty. The patient was positioned in the dorsal lithotomy position in [place] stirrups. The patient was examined under anesthesia, with the above noted findings.  The bi-valved speculum was placed inside the patient's vagina, and the the posterior lip of the cervix was seen and grasped with the tenaculum.  A paracervical block was achieved with 70mL of 1% lidocaine. The suction was then calibrated to 30m and a #9 cannula passed easily. It was connected to the cannula, which was then introduced with the above noted findings. A gentle curettage was done at the end and yield no products of conception.   The suction was then done one more time to remove any remaining curettage material. Speculum was removed and a bimanual massage was done. Speculum reintroduced and  silver nitrate applied to the tenaculum site.   Excellent hemostasis was noted, and all instruments were removed, with excellent hemostasis noted throughout.  She was then taken out of dorsal lithotomy. The patient tolerated the procedure well.  Sponge, lap and instrument counts were correct x2.  The patient was taken to recovery room in excellent condition.  A dose of methergine is to be given postoperatively  MD Attending Center for [facility])

## 2019-09-04 NOTE — H&P (Addendum)
Obstetrics & Gynecology H&P   Date of Admission: 09/04/2019   Requesting Provider: [facility]  Primary OBGYN: [facility] (Dr. Yolonda Kida) Primary Care Provider: Donald Prose  Reason for Admission: VB s/p cytotec for management for missed abortion  History of Present Illness: Sherry Fuentes is a 34 y.o. G2P0020, with the above CC. PMHx is significant for h/o cervical insufficiency, endometrosis, h/o d&c.  Pt has SLIUP at 7wks and 5d dx on 12/15 but on 1/12 she was diagnosed with a missed AB and given cytotec for management. She was tentatively set up for a d&c for tomorrow but presented earlier today for heavy vaginal bleeding. VB has slowed but still having some and H/H has dropped and u/s showed retained POCs in the lower uterine segment (see below). NPO since before MN.   Images reviewed.   ROS: A 12-point review of systems was performed and negative, except as stated in the above HPI.  OBGYN History: As per HPI.   Past Medical History: Past Medical History:  Diagnosis Date  . Asthma   . Frequency of urination   . GERD (gastroesophageal reflux disease)   . Interstitial cystitis   . Nocturia   . Pelvic pain in female   . Urgency of urination     Past Surgical History: Past Surgical History:  Procedure Laterality Date  . CYSTO WITH HYDRODISTENSION  08/02/2012   Procedure: CYSTOSCOPY/HYDRODISTENSION;  Surgeon: Reece Packer, MD;  Location: [facility];  Service: Urology;  Laterality: N/A;  Cystoscopy/Hydrodistension Instillation of Marcaine and Pyridum  . LAPAROSCOPY N/A 11/16/2015   Procedure: LAPAROSCOPY DIAGNOSTIC; LYSIS OF ADHESIONS;  Surgeon: Molli Posey, MD;  Location: [facility];  Service: Gynecology;  Laterality: N/A;  . NEGATIVE SLEEP STUDY  2015  . REMOVAL RIGHT WRIST CYST  2011  . TONSILLECTOMY  2015  . WISDOM TOOTH EXTRACTION  AGE 11    Family History:  No family history on file.  Social History:  Social History    Socioeconomic History  . Marital status: Married    Spouse name: Not on file  . Number of children: Not on file  . Years of education: Not on file  . Highest education level: Not on file  Occupational History  . Not on file  Tobacco Use  . Smoking status: Never Smoker  . Smokeless tobacco: Never Used  Substance and Sexual Activity  . Alcohol use: Not Currently    Comment: RARE  . Drug use: No  . Sexual activity: Not on file  Other Topics Concern  . Not on file  Social History Narrative  . Not on file   Social Determinants of Health   Financial Resource Strain:   . Difficulty of Paying Living Expenses: Not on file  Food Insecurity:   . Worried About Charity fundraiser in the Last Year: Not on file  . Ran Out of Food in the Last Year: Not on file  Transportation Needs:   . Lack of Transportation (Medical): Not on file  . Lack of Transportation (Non-Medical): Not on file  Physical Activity:   . Days of Exercise per Week: Not on file  . Minutes of Exercise per Session: Not on file  Stress:   . Feeling of Stress : Not on file  Social Connections:   . Frequency of Communication with Friends and Family: Not on file  . Frequency of Social Gatherings with Friends and Family: Not on file  . Attends Religious Services: Not on  file  . Active Member of Clubs or Organizations: Not on file  . Attends Banker Meetings: Not on file  . Marital Status: Not on file  Intimate Partner Violence:   . Fear of Current or Ex-Partner: Not on file  . Emotionally Abused: Not on file  . Physically Abused: Not on file  . Sexually Abused: Not on file    Allergy: No Known Allergies  Current Outpatient Medications: (Not in a hospital admission)    Hospital Medications: Current Facility-Administered Medications  Medication Dose Route Frequency Provider Last Rate Last Admin  . doxycycline (VIBRAMYCIN) 100 mg in sodium chloride 0.9 % 250 mL IVPB  100 mg Intravenous Once  [place] Bing, MD      . lactated ringers infusion   Intravenous Continuous [place] Bing, MD       Current Outpatient Medications  Medication Sig Dispense Refill  . Ascorbic Acid (VITAMIN C) 1000 MG tablet Take 1,000 mg by mouth daily.    [place] Kitchen azelastine (ASTELIN) 0.1 % nasal spray Place 2 sprays into both nostrils 2 (two) times daily. Use in each nostril as directed    . azelastine (OPTIVAR) 0.05 % ophthalmic solution Place 1 drop into both eyes 2 (two) times daily.    [place] Kitchen BREO ELLIPTA 100-25 MCG/INH AEPB Inhale 1 puff into the lungs daily.   5  . Cholecalciferol (VITAMIN D-3 PO) Take 5,000 Units by mouth daily.     [place] Kitchen EPINEPHrine 0.3 mg/0.3 mL IJ SOAJ injection Inject 0.3 mg into the muscle 3 times/day as needed-between meals & bedtime for allergies.    . fluticasone (FLONASE) 50 MCG/ACT nasal spray Place 1 spray into both nostrils daily.    . IODINE, KELP, PO Take 232 mcg by mouth daily.    [place] Kitchen levocetirizine (XYZAL) 5 MG tablet Take 5 mg by mouth every evening.    . montelukast (SINGULAIR) 10 MG tablet Take 10 mg by mouth at bedtime.    . Omega 3 1000 MG CAPS Take 1,000 mg by mouth daily.    [place] Kitchen PROAIR HFA 108 (90 Base) MCG/ACT inhaler Inhale 2 puffs into the lungs every 6 (six) hours as needed for wheezing or shortness of breath.   1  . Probiotic Product (PROBIOTIC PO) Take 1 tablet by mouth daily.    . vitamin B-12 (CYANOCOBALAMIN) 1000 MCG tablet Take 1,000 mcg by mouth every 3 (three) days.     [place] Kitchen HYDROcodone-acetaminophen (NORCO) 5-325 MG tablet Take 1-2 tablets by mouth every 6 (six) hours as needed (for pain). 20 tablet 0     Physical Exam:   Current Vital Signs 24h Vital Sign Ranges  T 98.2 F (36.8 C) Temp  Avg: 98.2 F (36.8 C)  Min: 98.2 F (36.8 C)  Max: 98.2 F (36.8 C)  BP 109/68 BP  Min: 103/56  Max: 125/90  HR 78 Pulse  Avg: 83.5  Min: 72  Max: 124  RR (!) 21 Resp  Avg: 17.5  Min: 11  Max: 22  SaO2 100 % Room Air SpO2  Avg: 98.5 %  Min: 96 %  Max: 100 %       24  Hour I/O Current Shift I/O  Time Ins Outs No intake/output data recorded. 01/13 0701 - 01/13 1900 In: 1864.8  Out: -    Patient Vitals for the past 24 hrs:  BP Temp Temp src Pulse Resp SpO2 Height Weight  09/04/19 0900 109/68 -- -- 78 (!) 21 100 % -- --  09/04/19 0830 (!)  110/55 -- -- 72 (!) 22 100 % -- --  09/04/19 0800 105/64 -- -- 76 15 100 % -- --  09/04/19 0730 (!) 120/44 -- -- 88 15 98 % -- --  09/04/19 0700 (!) 103/56 -- -- 78 15 100 % -- --  09/04/19 0630 104/63 -- -- 79 15 98 % -- --  09/04/19 0600 105/63 -- -- 79 18 97 % -- --  09/04/19 0545 107/69 -- -- 80 11 96 % -- --  09/04/19 0501 (!) 107/58 -- -- 87 18 99 % -- --  09/04/19 0430 109/63 -- -- 77 -- 98 % -- --  09/04/19 0400 109/62 -- -- 81 -- 98 % -- --  09/04/19 0330 (!) 107/57 -- -- 84 20 96 % -- --  09/04/19 0230 117/66 -- -- 86 18 98 % -- --  09/04/19 0204 103/65 -- -- 84 (!) 22 100 % -- --  09/04/19 0127 -- -- -- -- -- -- 5\' 7"  (1.702 m) 87.1 kg  09/04/19 0126 125/90 98.2 F (36.8 C) Oral (!) 124 18 100 % -- --    Body mass index is 30.07 kg/m. General appearance: Well nourished, well developed female in no acute distress.  Cardiovascular: S1, S2 normal, no murmur, rub or gallop, regular rate and rhythm Respiratory:  Clear to auscultation bilateral. Normal respiratory effort Abdomen: positive bowel sounds and no masses, hernias; diffusely non tender to palpation, non distended Neuro/Psych:  Normal mood and affect.  Skin:  Warm and dry.  Extremities: no clubbing, cyanosis, or edema.   Laboratory: Beta HCG: 2974 Recent Labs  Lab 09/04/19 0142 09/04/19 0730  WBC 13.1* 10.3  HGB 12.1 9.2*  HCT 38.2 28.0*  PLT 352 243   Recent Labs  Lab 09/04/19 0142  NA 137  K 3.8  CL 106  CO2 23  BUN 14  CREATININE 0.79  CALCIUM 8.9  GLUCOSE 127*   No results for input(s): APTT, INR, PTT in the last 168 hours.  Invalid input(s): DRHAPTT Recent Labs  Lab 09/04/19 0300  ABORH A POS Performed at [facility], [address], [place], [place]    COVID: negative  Imaging:  CLINICAL DATA:  Bleeding, miscarriage  EXAM: OBSTETRIC <14 WK [address] AND TRANSVAGINAL OB US  TECHNIQUE: Both transabdominal and transvaginal ultrasound examinations were performed for complete evaluation of the gestation as well as the maternal uterus, adnexal regions, and pelvic cul-de-sac. Transvaginal technique was performed to assess early pregnancy.  COMPARISON:  None.  FINDINGS: Intrauterine gestational sac: Probable gestational sac seen near the lower uterine segment/cervix.  Yolk sac:  Not Visualized.  Embryo:  Not Visualized.  MSD: 8.5 mm 5 w   5 d  Subchorionic hemorrhage:  None visualized.  Maternal uterus/adnexae: Within the left ovary there is a anechoic cyst. The right ovary is not well seen.  IMPRESSION: Small probable gestational sac seen near the lower uterine segment/cervix. These findings are suspicious but not yet definitive for failed pregnancy. Recommend follow-up US in 10-14 days for definitive diagnosis. This recommendation follows SRU consensus guidelines: Diagnostic Criteria for Nonviable Pregnancy Early in the First Trimester. 05-17-1979 Med [id number].   Electronically Signed   By: ; [id number] M.D.   On: 09/04/2019 02:41  Assessment: Ms. Fretwell is a 34 y.o. G2P0 with missed ab and VB, currently stable.  Plan: Pt consented for suction d&c, pt amenable to transfusion PRN. Should be fine to go home after surgery from  PACU  Can proceed when OR is ready  Cornelia Copa MD Attending Center for [facility] (Faculty Practice) [phone]

## 2019-09-04 NOTE — ED Provider Notes (Signed)
  Provider Note MRN:  [id number]  Arrival date & time: 09/04/19    ED Course and Medical Decision Making  Assumed care from Dr. Read Drivers at shift change.  Missed abortion, vaginal bleeding after starting Cytotec, largely reassuring work-up here today but very lightheaded with ambulating.  Providing additional liter of fluids, repeat CBC, reassess.  Patient has good follow-up with Dr. Kathlen Brunswick in [place].  8:30 AM update: Patient continues to be very weak and unable to ambulate without significant symptoms.  Her hemoglobin is dropped from 12 to 9.  Discussed this with Dr. Kathlen Brunswick of [facility], who explains that given the drop in the hemoglobin, he would not delay D&C until tomorrow and he would not delay D&C for transport.  Would rather have patient receive D&C here at [facility] long without delay to avoid any need for blood transfusion.  Dr. Vergie Living of [facility] was consulted and will come to see the patient and likely bring to the OR for D&C.  Patient remains hemodynamically stable.  .Critical Care Performed by: Sabas Sous, MD Authorized by: Sabas Sous, MD   Critical care provider statement:    Critical care time (minutes):  45   Critical care was necessary to treat or prevent imminent or life-threatening deterioration of the following conditions: Symptomatic anemia, missed abortion, need for urgent/emergent D&C.   Critical care was time spent personally by me on the following activities:  Discussions with consultants, evaluation of patient's response to treatment, examination of patient, ordering and performing treatments and interventions, ordering and review of laboratory studies, ordering and review of radiographic studies, pulse oximetry, re-evaluation of patient's condition, obtaining history from patient or surrogate and review of old charts   I assumed direction of critical care for this patient from another provider in my specialty: yes      Final Clinical  Impressions(s) / ED Diagnoses     ICD-10-CM   1. Retained products of conception after miscarriage  O03.4 US OB LESS THAN 14 WEEKS WITH OB TRANSVAGINAL    US OB LESS THAN 14 WEEKS WITH OB TRANSVAGINAL  2. Symptomatic anemia  D64.9     ED Discharge Orders         Ordered    HYDROcodone-acetaminophen (NORCO) 5-325 MG tablet  Every 6 hours PRN     09/04/19 0515          Discharge Instructions   None     Elmer Sow. Pilar Plate, MD [facility] [email or web address]    Sabas Sous, MD 09/04/19 [phone]

## 2019-09-05 LAB — SURGICAL PATHOLOGY

## 2019-09-09 DIAGNOSIS — O039 Complete or unspecified spontaneous abortion without complication: Secondary | ICD-10-CM | POA: Diagnosis not present

## 2019-09-09 DIAGNOSIS — Z8639 Personal history of other endocrine, nutritional and metabolic disease: Secondary | ICD-10-CM | POA: Diagnosis not present

## 2019-09-09 DIAGNOSIS — N96 Recurrent pregnancy loss: Secondary | ICD-10-CM | POA: Diagnosis not present

## 2019-09-09 DIAGNOSIS — J3081 Allergic rhinitis due to animal (cat) (dog) hair and dander: Secondary | ICD-10-CM | POA: Diagnosis not present

## 2019-09-09 DIAGNOSIS — O021 Missed abortion: Secondary | ICD-10-CM | POA: Diagnosis not present

## 2019-09-09 DIAGNOSIS — J301 Allergic rhinitis due to pollen: Secondary | ICD-10-CM | POA: Diagnosis not present

## 2019-09-09 DIAGNOSIS — J3089 Other allergic rhinitis: Secondary | ICD-10-CM | POA: Diagnosis not present

## 2019-09-18 DIAGNOSIS — J3081 Allergic rhinitis due to animal (cat) (dog) hair and dander: Secondary | ICD-10-CM | POA: Diagnosis not present

## 2019-09-18 DIAGNOSIS — J3089 Other allergic rhinitis: Secondary | ICD-10-CM | POA: Diagnosis not present

## 2019-09-18 DIAGNOSIS — J301 Allergic rhinitis due to pollen: Secondary | ICD-10-CM | POA: Diagnosis not present

## 2019-09-24 DIAGNOSIS — O021 Missed abortion: Secondary | ICD-10-CM | POA: Diagnosis not present

## 2019-09-24 DIAGNOSIS — E7212 Methylenetetrahydrofolate reductase deficiency: Secondary | ICD-10-CM | POA: Diagnosis not present

## 2019-09-24 DIAGNOSIS — N760 Acute vaginitis: Secondary | ICD-10-CM | POA: Diagnosis not present

## 2019-09-24 DIAGNOSIS — J3081 Allergic rhinitis due to animal (cat) (dog) hair and dander: Secondary | ICD-10-CM | POA: Diagnosis not present

## 2019-09-24 DIAGNOSIS — E221 Hyperprolactinemia: Secondary | ICD-10-CM | POA: Diagnosis not present

## 2019-09-24 DIAGNOSIS — J301 Allergic rhinitis due to pollen: Secondary | ICD-10-CM | POA: Diagnosis not present

## 2019-09-24 DIAGNOSIS — J3089 Other allergic rhinitis: Secondary | ICD-10-CM | POA: Diagnosis not present

## 2019-10-03 DIAGNOSIS — J3081 Allergic rhinitis due to animal (cat) (dog) hair and dander: Secondary | ICD-10-CM | POA: Diagnosis not present

## 2019-10-03 DIAGNOSIS — J301 Allergic rhinitis due to pollen: Secondary | ICD-10-CM | POA: Diagnosis not present

## 2019-10-09 DIAGNOSIS — J3081 Allergic rhinitis due to animal (cat) (dog) hair and dander: Secondary | ICD-10-CM | POA: Diagnosis not present

## 2019-10-09 DIAGNOSIS — J301 Allergic rhinitis due to pollen: Secondary | ICD-10-CM | POA: Diagnosis not present

## 2019-10-09 DIAGNOSIS — J3089 Other allergic rhinitis: Secondary | ICD-10-CM | POA: Diagnosis not present

## 2019-10-10 DIAGNOSIS — J301 Allergic rhinitis due to pollen: Secondary | ICD-10-CM | POA: Diagnosis not present

## 2019-10-11 DIAGNOSIS — J3081 Allergic rhinitis due to animal (cat) (dog) hair and dander: Secondary | ICD-10-CM | POA: Diagnosis not present

## 2019-10-11 DIAGNOSIS — J3089 Other allergic rhinitis: Secondary | ICD-10-CM | POA: Diagnosis not present

## 2019-10-17 DIAGNOSIS — J3089 Other allergic rhinitis: Secondary | ICD-10-CM | POA: Diagnosis not present

## 2019-10-17 DIAGNOSIS — J301 Allergic rhinitis due to pollen: Secondary | ICD-10-CM | POA: Diagnosis not present

## 2019-10-17 DIAGNOSIS — J3081 Allergic rhinitis due to animal (cat) (dog) hair and dander: Secondary | ICD-10-CM | POA: Diagnosis not present

## 2019-10-21 DIAGNOSIS — N3000 Acute cystitis without hematuria: Secondary | ICD-10-CM | POA: Diagnosis not present

## 2019-10-21 DIAGNOSIS — R8271 Bacteriuria: Secondary | ICD-10-CM | POA: Diagnosis not present

## 2019-10-23 DIAGNOSIS — J3089 Other allergic rhinitis: Secondary | ICD-10-CM | POA: Diagnosis not present

## 2019-10-23 DIAGNOSIS — J3081 Allergic rhinitis due to animal (cat) (dog) hair and dander: Secondary | ICD-10-CM | POA: Diagnosis not present

## 2019-10-23 DIAGNOSIS — J301 Allergic rhinitis due to pollen: Secondary | ICD-10-CM | POA: Diagnosis not present

## 2019-10-29 DIAGNOSIS — Z01419 Encounter for gynecological examination (general) (routine) without abnormal findings: Secondary | ICD-10-CM | POA: Diagnosis not present

## 2019-10-29 DIAGNOSIS — E010 Iodine-deficiency related diffuse (endemic) goiter: Secondary | ICD-10-CM | POA: Diagnosis not present

## 2019-10-29 DIAGNOSIS — Z1151 Encounter for screening for human papillomavirus (HPV): Secondary | ICD-10-CM | POA: Diagnosis not present

## 2019-10-29 DIAGNOSIS — N3011 Interstitial cystitis (chronic) with hematuria: Secondary | ICD-10-CM | POA: Diagnosis not present

## 2019-10-29 DIAGNOSIS — O021 Missed abortion: Secondary | ICD-10-CM | POA: Diagnosis not present

## 2019-11-01 DIAGNOSIS — J301 Allergic rhinitis due to pollen: Secondary | ICD-10-CM | POA: Diagnosis not present

## 2019-11-01 DIAGNOSIS — J3089 Other allergic rhinitis: Secondary | ICD-10-CM | POA: Diagnosis not present

## 2019-11-01 DIAGNOSIS — J3081 Allergic rhinitis due to animal (cat) (dog) hair and dander: Secondary | ICD-10-CM | POA: Diagnosis not present

## 2019-11-01 DIAGNOSIS — E042 Nontoxic multinodular goiter: Secondary | ICD-10-CM | POA: Diagnosis not present

## 2019-11-07 DIAGNOSIS — J3081 Allergic rhinitis due to animal (cat) (dog) hair and dander: Secondary | ICD-10-CM | POA: Diagnosis not present

## 2019-11-07 DIAGNOSIS — J301 Allergic rhinitis due to pollen: Secondary | ICD-10-CM | POA: Diagnosis not present

## 2019-11-07 DIAGNOSIS — J3089 Other allergic rhinitis: Secondary | ICD-10-CM | POA: Diagnosis not present

## 2019-11-15 DIAGNOSIS — J3081 Allergic rhinitis due to animal (cat) (dog) hair and dander: Secondary | ICD-10-CM | POA: Diagnosis not present

## 2019-11-15 DIAGNOSIS — J3089 Other allergic rhinitis: Secondary | ICD-10-CM | POA: Diagnosis not present

## 2019-11-15 DIAGNOSIS — J301 Allergic rhinitis due to pollen: Secondary | ICD-10-CM | POA: Diagnosis not present

## 2019-11-21 DIAGNOSIS — J3089 Other allergic rhinitis: Secondary | ICD-10-CM | POA: Diagnosis not present

## 2019-11-21 DIAGNOSIS — J301 Allergic rhinitis due to pollen: Secondary | ICD-10-CM | POA: Diagnosis not present

## 2019-11-21 DIAGNOSIS — J3081 Allergic rhinitis due to animal (cat) (dog) hair and dander: Secondary | ICD-10-CM | POA: Diagnosis not present

## 2019-11-25 DIAGNOSIS — J3081 Allergic rhinitis due to animal (cat) (dog) hair and dander: Secondary | ICD-10-CM | POA: Diagnosis not present

## 2019-11-25 DIAGNOSIS — J301 Allergic rhinitis due to pollen: Secondary | ICD-10-CM | POA: Diagnosis not present

## 2019-11-25 DIAGNOSIS — J3089 Other allergic rhinitis: Secondary | ICD-10-CM | POA: Diagnosis not present

## 2019-11-28 DIAGNOSIS — J3081 Allergic rhinitis due to animal (cat) (dog) hair and dander: Secondary | ICD-10-CM | POA: Diagnosis not present

## 2019-11-28 DIAGNOSIS — J3089 Other allergic rhinitis: Secondary | ICD-10-CM | POA: Diagnosis not present

## 2019-11-28 DIAGNOSIS — J301 Allergic rhinitis due to pollen: Secondary | ICD-10-CM | POA: Diagnosis not present

## 2019-12-04 DIAGNOSIS — J301 Allergic rhinitis due to pollen: Secondary | ICD-10-CM | POA: Diagnosis not present

## 2019-12-04 DIAGNOSIS — J3089 Other allergic rhinitis: Secondary | ICD-10-CM | POA: Diagnosis not present

## 2019-12-04 DIAGNOSIS — J3081 Allergic rhinitis due to animal (cat) (dog) hair and dander: Secondary | ICD-10-CM | POA: Diagnosis not present

## 2019-12-13 DIAGNOSIS — J3081 Allergic rhinitis due to animal (cat) (dog) hair and dander: Secondary | ICD-10-CM | POA: Diagnosis not present

## 2019-12-13 DIAGNOSIS — J301 Allergic rhinitis due to pollen: Secondary | ICD-10-CM | POA: Diagnosis not present

## 2019-12-13 DIAGNOSIS — J3089 Other allergic rhinitis: Secondary | ICD-10-CM | POA: Diagnosis not present

## 2019-12-20 DIAGNOSIS — J3089 Other allergic rhinitis: Secondary | ICD-10-CM | POA: Diagnosis not present

## 2019-12-20 DIAGNOSIS — J301 Allergic rhinitis due to pollen: Secondary | ICD-10-CM | POA: Diagnosis not present

## 2019-12-20 DIAGNOSIS — N3000 Acute cystitis without hematuria: Secondary | ICD-10-CM | POA: Diagnosis not present

## 2019-12-20 DIAGNOSIS — R8271 Bacteriuria: Secondary | ICD-10-CM | POA: Diagnosis not present

## 2019-12-20 DIAGNOSIS — J3081 Allergic rhinitis due to animal (cat) (dog) hair and dander: Secondary | ICD-10-CM | POA: Diagnosis not present

## 2019-12-24 DIAGNOSIS — J3089 Other allergic rhinitis: Secondary | ICD-10-CM | POA: Diagnosis not present

## 2019-12-24 DIAGNOSIS — J301 Allergic rhinitis due to pollen: Secondary | ICD-10-CM | POA: Diagnosis not present

## 2019-12-24 DIAGNOSIS — J3081 Allergic rhinitis due to animal (cat) (dog) hair and dander: Secondary | ICD-10-CM | POA: Diagnosis not present

## 2020-01-02 DIAGNOSIS — J3081 Allergic rhinitis due to animal (cat) (dog) hair and dander: Secondary | ICD-10-CM | POA: Diagnosis not present

## 2020-01-02 DIAGNOSIS — J3089 Other allergic rhinitis: Secondary | ICD-10-CM | POA: Diagnosis not present

## 2020-01-02 DIAGNOSIS — J301 Allergic rhinitis due to pollen: Secondary | ICD-10-CM | POA: Diagnosis not present

## 2020-01-09 DIAGNOSIS — J3089 Other allergic rhinitis: Secondary | ICD-10-CM | POA: Diagnosis not present

## 2020-01-09 DIAGNOSIS — J3081 Allergic rhinitis due to animal (cat) (dog) hair and dander: Secondary | ICD-10-CM | POA: Diagnosis not present

## 2020-01-09 DIAGNOSIS — J301 Allergic rhinitis due to pollen: Secondary | ICD-10-CM | POA: Diagnosis not present

## 2020-01-21 DIAGNOSIS — M6289 Other specified disorders of muscle: Secondary | ICD-10-CM | POA: Diagnosis not present

## 2020-01-21 DIAGNOSIS — M6281 Muscle weakness (generalized): Secondary | ICD-10-CM | POA: Diagnosis not present

## 2020-01-21 DIAGNOSIS — M62838 Other muscle spasm: Secondary | ICD-10-CM | POA: Diagnosis not present

## 2020-01-21 DIAGNOSIS — R109 Unspecified abdominal pain: Secondary | ICD-10-CM | POA: Diagnosis not present

## 2020-01-23 DIAGNOSIS — J3081 Allergic rhinitis due to animal (cat) (dog) hair and dander: Secondary | ICD-10-CM | POA: Diagnosis not present

## 2020-01-23 DIAGNOSIS — J301 Allergic rhinitis due to pollen: Secondary | ICD-10-CM | POA: Diagnosis not present

## 2020-01-23 DIAGNOSIS — J3089 Other allergic rhinitis: Secondary | ICD-10-CM | POA: Diagnosis not present

## 2020-01-30 DIAGNOSIS — J3089 Other allergic rhinitis: Secondary | ICD-10-CM | POA: Diagnosis not present

## 2020-01-30 DIAGNOSIS — J3081 Allergic rhinitis due to animal (cat) (dog) hair and dander: Secondary | ICD-10-CM | POA: Diagnosis not present

## 2020-01-30 DIAGNOSIS — J301 Allergic rhinitis due to pollen: Secondary | ICD-10-CM | POA: Diagnosis not present

## 2020-01-30 DIAGNOSIS — N3010 Interstitial cystitis (chronic) without hematuria: Secondary | ICD-10-CM | POA: Diagnosis not present

## 2020-02-07 DIAGNOSIS — R102 Pelvic and perineal pain: Secondary | ICD-10-CM | POA: Diagnosis not present

## 2020-02-07 DIAGNOSIS — M6289 Other specified disorders of muscle: Secondary | ICD-10-CM | POA: Diagnosis not present

## 2020-02-07 DIAGNOSIS — M6281 Muscle weakness (generalized): Secondary | ICD-10-CM | POA: Diagnosis not present

## 2020-02-07 DIAGNOSIS — J301 Allergic rhinitis due to pollen: Secondary | ICD-10-CM | POA: Diagnosis not present

## 2020-02-07 DIAGNOSIS — M62838 Other muscle spasm: Secondary | ICD-10-CM | POA: Diagnosis not present

## 2020-02-07 DIAGNOSIS — J3089 Other allergic rhinitis: Secondary | ICD-10-CM | POA: Diagnosis not present

## 2020-02-07 DIAGNOSIS — J3081 Allergic rhinitis due to animal (cat) (dog) hair and dander: Secondary | ICD-10-CM | POA: Diagnosis not present

## 2020-02-13 DIAGNOSIS — R1030 Lower abdominal pain, unspecified: Secondary | ICD-10-CM | POA: Diagnosis not present

## 2020-02-13 DIAGNOSIS — J3081 Allergic rhinitis due to animal (cat) (dog) hair and dander: Secondary | ICD-10-CM | POA: Diagnosis not present

## 2020-02-13 DIAGNOSIS — J301 Allergic rhinitis due to pollen: Secondary | ICD-10-CM | POA: Diagnosis not present

## 2020-02-13 DIAGNOSIS — R3914 Feeling of incomplete bladder emptying: Secondary | ICD-10-CM | POA: Diagnosis not present

## 2020-02-13 DIAGNOSIS — N3010 Interstitial cystitis (chronic) without hematuria: Secondary | ICD-10-CM | POA: Diagnosis not present

## 2020-02-13 DIAGNOSIS — R300 Dysuria: Secondary | ICD-10-CM | POA: Diagnosis not present

## 2020-02-13 DIAGNOSIS — J3089 Other allergic rhinitis: Secondary | ICD-10-CM | POA: Diagnosis not present

## 2020-02-21 DIAGNOSIS — J3089 Other allergic rhinitis: Secondary | ICD-10-CM | POA: Diagnosis not present

## 2020-02-21 DIAGNOSIS — J301 Allergic rhinitis due to pollen: Secondary | ICD-10-CM | POA: Diagnosis not present

## 2020-02-21 DIAGNOSIS — J3081 Allergic rhinitis due to animal (cat) (dog) hair and dander: Secondary | ICD-10-CM | POA: Diagnosis not present

## 2020-02-27 DIAGNOSIS — J301 Allergic rhinitis due to pollen: Secondary | ICD-10-CM | POA: Diagnosis not present

## 2020-02-27 DIAGNOSIS — J3089 Other allergic rhinitis: Secondary | ICD-10-CM | POA: Diagnosis not present

## 2020-02-27 DIAGNOSIS — R102 Pelvic and perineal pain: Secondary | ICD-10-CM | POA: Diagnosis not present

## 2020-02-27 DIAGNOSIS — M6281 Muscle weakness (generalized): Secondary | ICD-10-CM | POA: Diagnosis not present

## 2020-02-27 DIAGNOSIS — M62838 Other muscle spasm: Secondary | ICD-10-CM | POA: Diagnosis not present

## 2020-02-27 DIAGNOSIS — J3081 Allergic rhinitis due to animal (cat) (dog) hair and dander: Secondary | ICD-10-CM | POA: Diagnosis not present

## 2020-02-27 DIAGNOSIS — R109 Unspecified abdominal pain: Secondary | ICD-10-CM | POA: Diagnosis not present

## 2020-03-05 DIAGNOSIS — J301 Allergic rhinitis due to pollen: Secondary | ICD-10-CM | POA: Diagnosis not present

## 2020-03-05 DIAGNOSIS — J3081 Allergic rhinitis due to animal (cat) (dog) hair and dander: Secondary | ICD-10-CM | POA: Diagnosis not present

## 2020-03-05 DIAGNOSIS — J3089 Other allergic rhinitis: Secondary | ICD-10-CM | POA: Diagnosis not present

## 2020-03-06 DIAGNOSIS — R109 Unspecified abdominal pain: Secondary | ICD-10-CM | POA: Diagnosis not present

## 2020-03-06 DIAGNOSIS — N3010 Interstitial cystitis (chronic) without hematuria: Secondary | ICD-10-CM | POA: Diagnosis not present

## 2020-03-06 DIAGNOSIS — R3914 Feeling of incomplete bladder emptying: Secondary | ICD-10-CM | POA: Diagnosis not present

## 2020-03-06 DIAGNOSIS — R1030 Lower abdominal pain, unspecified: Secondary | ICD-10-CM | POA: Diagnosis not present

## 2020-03-11 DIAGNOSIS — J3081 Allergic rhinitis due to animal (cat) (dog) hair and dander: Secondary | ICD-10-CM | POA: Diagnosis not present

## 2020-03-11 DIAGNOSIS — J3089 Other allergic rhinitis: Secondary | ICD-10-CM | POA: Diagnosis not present

## 2020-03-11 DIAGNOSIS — J301 Allergic rhinitis due to pollen: Secondary | ICD-10-CM | POA: Diagnosis not present

## 2020-03-13 DIAGNOSIS — N3010 Interstitial cystitis (chronic) without hematuria: Secondary | ICD-10-CM | POA: Diagnosis not present

## 2020-03-13 DIAGNOSIS — M62838 Other muscle spasm: Secondary | ICD-10-CM | POA: Diagnosis not present

## 2020-03-13 DIAGNOSIS — M6281 Muscle weakness (generalized): Secondary | ICD-10-CM | POA: Diagnosis not present

## 2020-03-13 DIAGNOSIS — J3081 Allergic rhinitis due to animal (cat) (dog) hair and dander: Secondary | ICD-10-CM | POA: Diagnosis not present

## 2020-03-13 DIAGNOSIS — R102 Pelvic and perineal pain: Secondary | ICD-10-CM | POA: Diagnosis not present

## 2020-03-13 DIAGNOSIS — J3089 Other allergic rhinitis: Secondary | ICD-10-CM | POA: Diagnosis not present

## 2020-03-13 DIAGNOSIS — J301 Allergic rhinitis due to pollen: Secondary | ICD-10-CM | POA: Diagnosis not present

## 2020-03-20 DIAGNOSIS — J3089 Other allergic rhinitis: Secondary | ICD-10-CM | POA: Diagnosis not present

## 2020-03-20 DIAGNOSIS — J3081 Allergic rhinitis due to animal (cat) (dog) hair and dander: Secondary | ICD-10-CM | POA: Diagnosis not present

## 2020-03-20 DIAGNOSIS — J301 Allergic rhinitis due to pollen: Secondary | ICD-10-CM | POA: Diagnosis not present

## 2020-03-26 DIAGNOSIS — J3081 Allergic rhinitis due to animal (cat) (dog) hair and dander: Secondary | ICD-10-CM | POA: Diagnosis not present

## 2020-03-26 DIAGNOSIS — J3089 Other allergic rhinitis: Secondary | ICD-10-CM | POA: Diagnosis not present

## 2020-03-26 DIAGNOSIS — J301 Allergic rhinitis due to pollen: Secondary | ICD-10-CM | POA: Diagnosis not present

## 2020-03-27 DIAGNOSIS — M6281 Muscle weakness (generalized): Secondary | ICD-10-CM | POA: Diagnosis not present

## 2020-03-27 DIAGNOSIS — M62838 Other muscle spasm: Secondary | ICD-10-CM | POA: Diagnosis not present

## 2020-03-27 DIAGNOSIS — R1030 Lower abdominal pain, unspecified: Secondary | ICD-10-CM | POA: Diagnosis not present

## 2020-03-27 DIAGNOSIS — R102 Pelvic and perineal pain: Secondary | ICD-10-CM | POA: Diagnosis not present

## 2020-04-02 DIAGNOSIS — N805 Endometriosis of intestine: Secondary | ICD-10-CM | POA: Diagnosis not present

## 2020-04-02 DIAGNOSIS — N3000 Acute cystitis without hematuria: Secondary | ICD-10-CM | POA: Diagnosis not present

## 2020-04-02 DIAGNOSIS — J301 Allergic rhinitis due to pollen: Secondary | ICD-10-CM | POA: Diagnosis not present

## 2020-04-02 DIAGNOSIS — J3089 Other allergic rhinitis: Secondary | ICD-10-CM | POA: Diagnosis not present

## 2020-04-02 DIAGNOSIS — R109 Unspecified abdominal pain: Secondary | ICD-10-CM | POA: Diagnosis not present

## 2020-04-02 DIAGNOSIS — R300 Dysuria: Secondary | ICD-10-CM | POA: Diagnosis not present

## 2020-04-02 DIAGNOSIS — J3081 Allergic rhinitis due to animal (cat) (dog) hair and dander: Secondary | ICD-10-CM | POA: Diagnosis not present

## 2020-04-06 DIAGNOSIS — J301 Allergic rhinitis due to pollen: Secondary | ICD-10-CM | POA: Diagnosis not present

## 2020-04-06 DIAGNOSIS — J3089 Other allergic rhinitis: Secondary | ICD-10-CM | POA: Diagnosis not present

## 2020-04-06 DIAGNOSIS — J3081 Allergic rhinitis due to animal (cat) (dog) hair and dander: Secondary | ICD-10-CM | POA: Diagnosis not present

## 2020-04-09 DIAGNOSIS — J301 Allergic rhinitis due to pollen: Secondary | ICD-10-CM | POA: Diagnosis not present

## 2020-04-09 DIAGNOSIS — J3081 Allergic rhinitis due to animal (cat) (dog) hair and dander: Secondary | ICD-10-CM | POA: Diagnosis not present

## 2020-04-09 DIAGNOSIS — J3089 Other allergic rhinitis: Secondary | ICD-10-CM | POA: Diagnosis not present

## 2020-04-13 DIAGNOSIS — J3089 Other allergic rhinitis: Secondary | ICD-10-CM | POA: Diagnosis not present

## 2020-04-13 DIAGNOSIS — J301 Allergic rhinitis due to pollen: Secondary | ICD-10-CM | POA: Diagnosis not present

## 2020-04-13 DIAGNOSIS — J3081 Allergic rhinitis due to animal (cat) (dog) hair and dander: Secondary | ICD-10-CM | POA: Diagnosis not present

## 2020-04-16 DIAGNOSIS — J3081 Allergic rhinitis due to animal (cat) (dog) hair and dander: Secondary | ICD-10-CM | POA: Diagnosis not present

## 2020-04-16 DIAGNOSIS — J3089 Other allergic rhinitis: Secondary | ICD-10-CM | POA: Diagnosis not present

## 2020-04-16 DIAGNOSIS — J301 Allergic rhinitis due to pollen: Secondary | ICD-10-CM | POA: Diagnosis not present

## 2020-04-20 DIAGNOSIS — J3089 Other allergic rhinitis: Secondary | ICD-10-CM | POA: Diagnosis not present

## 2020-04-20 DIAGNOSIS — J3081 Allergic rhinitis due to animal (cat) (dog) hair and dander: Secondary | ICD-10-CM | POA: Diagnosis not present

## 2020-04-20 DIAGNOSIS — J301 Allergic rhinitis due to pollen: Secondary | ICD-10-CM | POA: Diagnosis not present

## 2020-04-24 DIAGNOSIS — M62838 Other muscle spasm: Secondary | ICD-10-CM | POA: Diagnosis not present

## 2020-04-24 DIAGNOSIS — R109 Unspecified abdominal pain: Secondary | ICD-10-CM | POA: Diagnosis not present

## 2020-04-24 DIAGNOSIS — M6281 Muscle weakness (generalized): Secondary | ICD-10-CM | POA: Diagnosis not present

## 2020-04-24 DIAGNOSIS — R102 Pelvic and perineal pain: Secondary | ICD-10-CM | POA: Diagnosis not present

## 2020-05-01 DIAGNOSIS — J301 Allergic rhinitis due to pollen: Secondary | ICD-10-CM | POA: Diagnosis not present

## 2020-05-01 DIAGNOSIS — J3081 Allergic rhinitis due to animal (cat) (dog) hair and dander: Secondary | ICD-10-CM | POA: Diagnosis not present

## 2020-05-01 DIAGNOSIS — J3089 Other allergic rhinitis: Secondary | ICD-10-CM | POA: Diagnosis not present

## 2020-05-07 DIAGNOSIS — J3081 Allergic rhinitis due to animal (cat) (dog) hair and dander: Secondary | ICD-10-CM | POA: Diagnosis not present

## 2020-05-07 DIAGNOSIS — J3089 Other allergic rhinitis: Secondary | ICD-10-CM | POA: Diagnosis not present

## 2020-05-07 DIAGNOSIS — J301 Allergic rhinitis due to pollen: Secondary | ICD-10-CM | POA: Diagnosis not present

## 2020-05-08 DIAGNOSIS — M62838 Other muscle spasm: Secondary | ICD-10-CM | POA: Diagnosis not present

## 2020-05-08 DIAGNOSIS — M6281 Muscle weakness (generalized): Secondary | ICD-10-CM | POA: Diagnosis not present

## 2020-05-08 DIAGNOSIS — K5900 Constipation, unspecified: Secondary | ICD-10-CM | POA: Diagnosis not present

## 2020-05-08 DIAGNOSIS — R102 Pelvic and perineal pain: Secondary | ICD-10-CM | POA: Diagnosis not present

## 2020-05-15 DIAGNOSIS — J3081 Allergic rhinitis due to animal (cat) (dog) hair and dander: Secondary | ICD-10-CM | POA: Diagnosis not present

## 2020-05-15 DIAGNOSIS — J3089 Other allergic rhinitis: Secondary | ICD-10-CM | POA: Diagnosis not present

## 2020-05-15 DIAGNOSIS — J301 Allergic rhinitis due to pollen: Secondary | ICD-10-CM | POA: Diagnosis not present

## 2020-05-19 DIAGNOSIS — J3089 Other allergic rhinitis: Secondary | ICD-10-CM | POA: Diagnosis not present

## 2020-05-19 DIAGNOSIS — J3081 Allergic rhinitis due to animal (cat) (dog) hair and dander: Secondary | ICD-10-CM | POA: Diagnosis not present

## 2020-05-19 DIAGNOSIS — J301 Allergic rhinitis due to pollen: Secondary | ICD-10-CM | POA: Diagnosis not present

## 2020-05-21 DIAGNOSIS — D2261 Melanocytic nevi of right upper limb, including shoulder: Secondary | ICD-10-CM | POA: Diagnosis not present

## 2020-05-21 DIAGNOSIS — D2271 Melanocytic nevi of right lower limb, including hip: Secondary | ICD-10-CM | POA: Diagnosis not present

## 2020-05-21 DIAGNOSIS — D2262 Melanocytic nevi of left upper limb, including shoulder: Secondary | ICD-10-CM | POA: Diagnosis not present

## 2020-05-21 DIAGNOSIS — D2272 Melanocytic nevi of left lower limb, including hip: Secondary | ICD-10-CM | POA: Diagnosis not present

## 2020-05-22 DIAGNOSIS — M62838 Other muscle spasm: Secondary | ICD-10-CM | POA: Diagnosis not present

## 2020-05-22 DIAGNOSIS — R109 Unspecified abdominal pain: Secondary | ICD-10-CM | POA: Diagnosis not present

## 2020-05-22 DIAGNOSIS — R102 Pelvic and perineal pain: Secondary | ICD-10-CM | POA: Diagnosis not present

## 2020-05-22 DIAGNOSIS — M6281 Muscle weakness (generalized): Secondary | ICD-10-CM | POA: Diagnosis not present

## 2020-05-26 DIAGNOSIS — J3089 Other allergic rhinitis: Secondary | ICD-10-CM | POA: Diagnosis not present

## 2020-05-26 DIAGNOSIS — J3081 Allergic rhinitis due to animal (cat) (dog) hair and dander: Secondary | ICD-10-CM | POA: Diagnosis not present

## 2020-05-26 DIAGNOSIS — J301 Allergic rhinitis due to pollen: Secondary | ICD-10-CM | POA: Diagnosis not present

## 2020-06-02 DIAGNOSIS — Z789 Other specified health status: Secondary | ICD-10-CM | POA: Diagnosis not present

## 2020-06-02 DIAGNOSIS — Q383 Other congenital malformations of tongue: Secondary | ICD-10-CM | POA: Diagnosis not present

## 2020-06-02 DIAGNOSIS — K146 Glossodynia: Secondary | ICD-10-CM | POA: Diagnosis not present

## 2020-06-04 DIAGNOSIS — J3089 Other allergic rhinitis: Secondary | ICD-10-CM | POA: Diagnosis not present

## 2020-06-04 DIAGNOSIS — R102 Pelvic and perineal pain: Secondary | ICD-10-CM | POA: Diagnosis not present

## 2020-06-04 DIAGNOSIS — J301 Allergic rhinitis due to pollen: Secondary | ICD-10-CM | POA: Diagnosis not present

## 2020-06-04 DIAGNOSIS — M26603 Bilateral temporomandibular joint disorder, unspecified: Secondary | ICD-10-CM | POA: Diagnosis not present

## 2020-06-04 DIAGNOSIS — J3081 Allergic rhinitis due to animal (cat) (dog) hair and dander: Secondary | ICD-10-CM | POA: Diagnosis not present

## 2020-06-04 DIAGNOSIS — M6281 Muscle weakness (generalized): Secondary | ICD-10-CM | POA: Diagnosis not present

## 2020-06-04 DIAGNOSIS — M62838 Other muscle spasm: Secondary | ICD-10-CM | POA: Diagnosis not present

## 2020-06-12 DIAGNOSIS — J301 Allergic rhinitis due to pollen: Secondary | ICD-10-CM | POA: Diagnosis not present

## 2020-06-12 DIAGNOSIS — J4530 Mild persistent asthma, uncomplicated: Secondary | ICD-10-CM | POA: Diagnosis not present

## 2020-06-12 DIAGNOSIS — J3089 Other allergic rhinitis: Secondary | ICD-10-CM | POA: Diagnosis not present

## 2020-06-12 DIAGNOSIS — H1045 Other chronic allergic conjunctivitis: Secondary | ICD-10-CM | POA: Diagnosis not present

## 2020-06-15 DIAGNOSIS — J3081 Allergic rhinitis due to animal (cat) (dog) hair and dander: Secondary | ICD-10-CM | POA: Diagnosis not present

## 2020-06-15 DIAGNOSIS — J3089 Other allergic rhinitis: Secondary | ICD-10-CM | POA: Diagnosis not present

## 2020-06-15 DIAGNOSIS — J301 Allergic rhinitis due to pollen: Secondary | ICD-10-CM | POA: Diagnosis not present

## 2020-06-19 DIAGNOSIS — R102 Pelvic and perineal pain: Secondary | ICD-10-CM | POA: Diagnosis not present

## 2020-06-19 DIAGNOSIS — M6281 Muscle weakness (generalized): Secondary | ICD-10-CM | POA: Diagnosis not present

## 2020-06-19 DIAGNOSIS — M62838 Other muscle spasm: Secondary | ICD-10-CM | POA: Diagnosis not present

## 2020-06-19 DIAGNOSIS — R1030 Lower abdominal pain, unspecified: Secondary | ICD-10-CM | POA: Diagnosis not present

## 2020-06-23 DIAGNOSIS — K219 Gastro-esophageal reflux disease without esophagitis: Secondary | ICD-10-CM | POA: Diagnosis not present

## 2020-06-23 DIAGNOSIS — R490 Dysphonia: Secondary | ICD-10-CM | POA: Diagnosis not present

## 2020-06-23 DIAGNOSIS — R070 Pain in throat: Secondary | ICD-10-CM | POA: Diagnosis not present

## 2020-06-30 DIAGNOSIS — J301 Allergic rhinitis due to pollen: Secondary | ICD-10-CM | POA: Diagnosis not present

## 2020-06-30 DIAGNOSIS — J3089 Other allergic rhinitis: Secondary | ICD-10-CM | POA: Diagnosis not present

## 2020-06-30 DIAGNOSIS — J3081 Allergic rhinitis due to animal (cat) (dog) hair and dander: Secondary | ICD-10-CM | POA: Diagnosis not present

## 2020-07-02 DIAGNOSIS — J3081 Allergic rhinitis due to animal (cat) (dog) hair and dander: Secondary | ICD-10-CM | POA: Diagnosis not present

## 2020-07-02 DIAGNOSIS — J301 Allergic rhinitis due to pollen: Secondary | ICD-10-CM | POA: Diagnosis not present

## 2020-07-02 DIAGNOSIS — J3089 Other allergic rhinitis: Secondary | ICD-10-CM | POA: Diagnosis not present

## 2020-07-07 DIAGNOSIS — J301 Allergic rhinitis due to pollen: Secondary | ICD-10-CM | POA: Diagnosis not present

## 2020-07-07 DIAGNOSIS — J3089 Other allergic rhinitis: Secondary | ICD-10-CM | POA: Diagnosis not present

## 2020-07-07 DIAGNOSIS — J3081 Allergic rhinitis due to animal (cat) (dog) hair and dander: Secondary | ICD-10-CM | POA: Diagnosis not present

## 2020-07-09 DIAGNOSIS — J301 Allergic rhinitis due to pollen: Secondary | ICD-10-CM | POA: Diagnosis not present

## 2020-07-09 DIAGNOSIS — J3081 Allergic rhinitis due to animal (cat) (dog) hair and dander: Secondary | ICD-10-CM | POA: Diagnosis not present

## 2020-07-09 DIAGNOSIS — J3089 Other allergic rhinitis: Secondary | ICD-10-CM | POA: Diagnosis not present

## 2020-07-14 DIAGNOSIS — J3081 Allergic rhinitis due to animal (cat) (dog) hair and dander: Secondary | ICD-10-CM | POA: Diagnosis not present

## 2020-07-14 DIAGNOSIS — J3089 Other allergic rhinitis: Secondary | ICD-10-CM | POA: Diagnosis not present

## 2020-07-14 DIAGNOSIS — J301 Allergic rhinitis due to pollen: Secondary | ICD-10-CM | POA: Diagnosis not present

## 2020-07-23 DIAGNOSIS — D2261 Melanocytic nevi of right upper limb, including shoulder: Secondary | ICD-10-CM | POA: Diagnosis not present

## 2020-07-23 DIAGNOSIS — D485 Neoplasm of uncertain behavior of skin: Secondary | ICD-10-CM | POA: Diagnosis not present

## 2020-07-24 DIAGNOSIS — J301 Allergic rhinitis due to pollen: Secondary | ICD-10-CM | POA: Diagnosis not present

## 2020-07-24 DIAGNOSIS — J3089 Other allergic rhinitis: Secondary | ICD-10-CM | POA: Diagnosis not present

## 2020-07-24 DIAGNOSIS — J3081 Allergic rhinitis due to animal (cat) (dog) hair and dander: Secondary | ICD-10-CM | POA: Diagnosis not present

## 2020-07-28 DIAGNOSIS — J3081 Allergic rhinitis due to animal (cat) (dog) hair and dander: Secondary | ICD-10-CM | POA: Diagnosis not present

## 2020-07-28 DIAGNOSIS — J301 Allergic rhinitis due to pollen: Secondary | ICD-10-CM | POA: Diagnosis not present

## 2020-07-28 DIAGNOSIS — J3089 Other allergic rhinitis: Secondary | ICD-10-CM | POA: Diagnosis not present

## 2020-07-30 DIAGNOSIS — D485 Neoplasm of uncertain behavior of skin: Secondary | ICD-10-CM | POA: Diagnosis not present

## 2020-07-30 DIAGNOSIS — L988 Other specified disorders of the skin and subcutaneous tissue: Secondary | ICD-10-CM | POA: Diagnosis not present

## 2020-08-04 DIAGNOSIS — K219 Gastro-esophageal reflux disease without esophagitis: Secondary | ICD-10-CM | POA: Diagnosis not present

## 2020-08-04 DIAGNOSIS — R070 Pain in throat: Secondary | ICD-10-CM | POA: Diagnosis not present

## 2020-08-04 DIAGNOSIS — R490 Dysphonia: Secondary | ICD-10-CM | POA: Diagnosis not present

## 2020-08-05 DIAGNOSIS — J301 Allergic rhinitis due to pollen: Secondary | ICD-10-CM | POA: Diagnosis not present

## 2020-08-05 DIAGNOSIS — J3081 Allergic rhinitis due to animal (cat) (dog) hair and dander: Secondary | ICD-10-CM | POA: Diagnosis not present

## 2020-08-05 DIAGNOSIS — J3089 Other allergic rhinitis: Secondary | ICD-10-CM | POA: Diagnosis not present

## 2020-08-10 DIAGNOSIS — N390 Urinary tract infection, site not specified: Secondary | ICD-10-CM | POA: Diagnosis not present

## 2020-08-10 DIAGNOSIS — J3089 Other allergic rhinitis: Secondary | ICD-10-CM | POA: Diagnosis not present

## 2020-08-10 DIAGNOSIS — J3081 Allergic rhinitis due to animal (cat) (dog) hair and dander: Secondary | ICD-10-CM | POA: Diagnosis not present

## 2020-08-10 DIAGNOSIS — J301 Allergic rhinitis due to pollen: Secondary | ICD-10-CM | POA: Diagnosis not present

## 2020-08-10 DIAGNOSIS — R350 Frequency of micturition: Secondary | ICD-10-CM | POA: Diagnosis not present

## 2020-08-10 DIAGNOSIS — B961 Klebsiella pneumoniae [K. pneumoniae] as the cause of diseases classified elsewhere: Secondary | ICD-10-CM | POA: Diagnosis not present

## 2020-08-10 DIAGNOSIS — R1030 Lower abdominal pain, unspecified: Secondary | ICD-10-CM | POA: Diagnosis not present

## 2020-08-20 DIAGNOSIS — N3000 Acute cystitis without hematuria: Secondary | ICD-10-CM | POA: Diagnosis not present

## 2020-08-20 DIAGNOSIS — R8271 Bacteriuria: Secondary | ICD-10-CM | POA: Diagnosis not present

## 2020-08-20 DIAGNOSIS — N3010 Interstitial cystitis (chronic) without hematuria: Secondary | ICD-10-CM | POA: Diagnosis not present

## 2020-08-20 DIAGNOSIS — J301 Allergic rhinitis due to pollen: Secondary | ICD-10-CM | POA: Diagnosis not present

## 2020-08-20 DIAGNOSIS — J3081 Allergic rhinitis due to animal (cat) (dog) hair and dander: Secondary | ICD-10-CM | POA: Diagnosis not present

## 2020-08-20 DIAGNOSIS — J3089 Other allergic rhinitis: Secondary | ICD-10-CM | POA: Diagnosis not present

## 2020-08-28 DIAGNOSIS — J3081 Allergic rhinitis due to animal (cat) (dog) hair and dander: Secondary | ICD-10-CM | POA: Diagnosis not present

## 2020-08-28 DIAGNOSIS — J3089 Other allergic rhinitis: Secondary | ICD-10-CM | POA: Diagnosis not present

## 2020-08-28 DIAGNOSIS — J301 Allergic rhinitis due to pollen: Secondary | ICD-10-CM | POA: Diagnosis not present

## 2020-09-01 DIAGNOSIS — J3081 Allergic rhinitis due to animal (cat) (dog) hair and dander: Secondary | ICD-10-CM | POA: Diagnosis not present

## 2020-09-01 DIAGNOSIS — J3089 Other allergic rhinitis: Secondary | ICD-10-CM | POA: Diagnosis not present

## 2020-09-01 DIAGNOSIS — J301 Allergic rhinitis due to pollen: Secondary | ICD-10-CM | POA: Diagnosis not present

## 2020-09-04 DIAGNOSIS — J3089 Other allergic rhinitis: Secondary | ICD-10-CM | POA: Diagnosis not present

## 2020-09-04 DIAGNOSIS — J301 Allergic rhinitis due to pollen: Secondary | ICD-10-CM | POA: Diagnosis not present

## 2020-09-04 DIAGNOSIS — J3081 Allergic rhinitis due to animal (cat) (dog) hair and dander: Secondary | ICD-10-CM | POA: Diagnosis not present

## 2020-09-09 DIAGNOSIS — J301 Allergic rhinitis due to pollen: Secondary | ICD-10-CM | POA: Diagnosis not present

## 2020-09-09 DIAGNOSIS — J3089 Other allergic rhinitis: Secondary | ICD-10-CM | POA: Diagnosis not present

## 2020-09-09 DIAGNOSIS — J3081 Allergic rhinitis due to animal (cat) (dog) hair and dander: Secondary | ICD-10-CM | POA: Diagnosis not present

## 2020-09-11 DIAGNOSIS — J3089 Other allergic rhinitis: Secondary | ICD-10-CM | POA: Diagnosis not present

## 2020-09-11 DIAGNOSIS — J3081 Allergic rhinitis due to animal (cat) (dog) hair and dander: Secondary | ICD-10-CM | POA: Diagnosis not present

## 2020-09-11 DIAGNOSIS — J301 Allergic rhinitis due to pollen: Secondary | ICD-10-CM | POA: Diagnosis not present

## 2020-09-15 DIAGNOSIS — J3081 Allergic rhinitis due to animal (cat) (dog) hair and dander: Secondary | ICD-10-CM | POA: Diagnosis not present

## 2020-09-15 DIAGNOSIS — J301 Allergic rhinitis due to pollen: Secondary | ICD-10-CM | POA: Diagnosis not present

## 2020-09-15 DIAGNOSIS — J3089 Other allergic rhinitis: Secondary | ICD-10-CM | POA: Diagnosis not present

## 2020-09-25 DIAGNOSIS — J301 Allergic rhinitis due to pollen: Secondary | ICD-10-CM | POA: Diagnosis not present

## 2020-09-25 DIAGNOSIS — J3081 Allergic rhinitis due to animal (cat) (dog) hair and dander: Secondary | ICD-10-CM | POA: Diagnosis not present

## 2020-09-25 DIAGNOSIS — J3089 Other allergic rhinitis: Secondary | ICD-10-CM | POA: Diagnosis not present

## 2020-09-29 DIAGNOSIS — J3089 Other allergic rhinitis: Secondary | ICD-10-CM | POA: Diagnosis not present

## 2020-09-29 DIAGNOSIS — J301 Allergic rhinitis due to pollen: Secondary | ICD-10-CM | POA: Diagnosis not present

## 2020-09-29 DIAGNOSIS — J3081 Allergic rhinitis due to animal (cat) (dog) hair and dander: Secondary | ICD-10-CM | POA: Diagnosis not present

## 2020-10-02 DIAGNOSIS — J3089 Other allergic rhinitis: Secondary | ICD-10-CM | POA: Diagnosis not present

## 2020-10-02 DIAGNOSIS — J3081 Allergic rhinitis due to animal (cat) (dog) hair and dander: Secondary | ICD-10-CM | POA: Diagnosis not present

## 2020-10-02 DIAGNOSIS — J301 Allergic rhinitis due to pollen: Secondary | ICD-10-CM | POA: Diagnosis not present

## 2020-10-06 DIAGNOSIS — J3089 Other allergic rhinitis: Secondary | ICD-10-CM | POA: Diagnosis not present

## 2020-10-06 DIAGNOSIS — J301 Allergic rhinitis due to pollen: Secondary | ICD-10-CM | POA: Diagnosis not present

## 2020-10-06 DIAGNOSIS — J3081 Allergic rhinitis due to animal (cat) (dog) hair and dander: Secondary | ICD-10-CM | POA: Diagnosis not present

## 2020-10-07 DIAGNOSIS — R102 Pelvic and perineal pain: Secondary | ICD-10-CM | POA: Diagnosis not present

## 2020-10-07 DIAGNOSIS — R109 Unspecified abdominal pain: Secondary | ICD-10-CM | POA: Diagnosis not present

## 2020-10-07 DIAGNOSIS — R1030 Lower abdominal pain, unspecified: Secondary | ICD-10-CM | POA: Diagnosis not present

## 2020-10-07 DIAGNOSIS — R3914 Feeling of incomplete bladder emptying: Secondary | ICD-10-CM | POA: Diagnosis not present

## 2020-10-09 DIAGNOSIS — J301 Allergic rhinitis due to pollen: Secondary | ICD-10-CM | POA: Diagnosis not present

## 2020-10-09 DIAGNOSIS — J3081 Allergic rhinitis due to animal (cat) (dog) hair and dander: Secondary | ICD-10-CM | POA: Diagnosis not present

## 2020-10-09 DIAGNOSIS — J3089 Other allergic rhinitis: Secondary | ICD-10-CM | POA: Diagnosis not present

## 2020-10-16 DIAGNOSIS — J3081 Allergic rhinitis due to animal (cat) (dog) hair and dander: Secondary | ICD-10-CM | POA: Diagnosis not present

## 2020-10-16 DIAGNOSIS — J3089 Other allergic rhinitis: Secondary | ICD-10-CM | POA: Diagnosis not present

## 2020-10-16 DIAGNOSIS — J301 Allergic rhinitis due to pollen: Secondary | ICD-10-CM | POA: Diagnosis not present

## 2020-10-19 DIAGNOSIS — J3081 Allergic rhinitis due to animal (cat) (dog) hair and dander: Secondary | ICD-10-CM | POA: Diagnosis not present

## 2020-10-19 DIAGNOSIS — J3089 Other allergic rhinitis: Secondary | ICD-10-CM | POA: Diagnosis not present

## 2020-10-19 DIAGNOSIS — J301 Allergic rhinitis due to pollen: Secondary | ICD-10-CM | POA: Diagnosis not present

## 2020-10-22 DIAGNOSIS — R102 Pelvic and perineal pain: Secondary | ICD-10-CM | POA: Diagnosis not present

## 2020-10-22 DIAGNOSIS — M62838 Other muscle spasm: Secondary | ICD-10-CM | POA: Diagnosis not present

## 2020-10-22 DIAGNOSIS — M6289 Other specified disorders of muscle: Secondary | ICD-10-CM | POA: Diagnosis not present

## 2020-10-22 DIAGNOSIS — M6281 Muscle weakness (generalized): Secondary | ICD-10-CM | POA: Diagnosis not present

## 2020-10-27 DIAGNOSIS — J3081 Allergic rhinitis due to animal (cat) (dog) hair and dander: Secondary | ICD-10-CM | POA: Diagnosis not present

## 2020-10-27 DIAGNOSIS — J3089 Other allergic rhinitis: Secondary | ICD-10-CM | POA: Diagnosis not present

## 2020-10-27 DIAGNOSIS — J301 Allergic rhinitis due to pollen: Secondary | ICD-10-CM | POA: Diagnosis not present

## 2020-10-29 DIAGNOSIS — M6289 Other specified disorders of muscle: Secondary | ICD-10-CM | POA: Diagnosis not present

## 2020-10-29 DIAGNOSIS — M62838 Other muscle spasm: Secondary | ICD-10-CM | POA: Diagnosis not present

## 2020-10-29 DIAGNOSIS — K5900 Constipation, unspecified: Secondary | ICD-10-CM | POA: Diagnosis not present

## 2020-10-29 DIAGNOSIS — M6281 Muscle weakness (generalized): Secondary | ICD-10-CM | POA: Diagnosis not present

## 2020-11-05 DIAGNOSIS — J3089 Other allergic rhinitis: Secondary | ICD-10-CM | POA: Diagnosis not present

## 2020-11-05 DIAGNOSIS — M6281 Muscle weakness (generalized): Secondary | ICD-10-CM | POA: Diagnosis not present

## 2020-11-05 DIAGNOSIS — J3081 Allergic rhinitis due to animal (cat) (dog) hair and dander: Secondary | ICD-10-CM | POA: Diagnosis not present

## 2020-11-05 DIAGNOSIS — M6289 Other specified disorders of muscle: Secondary | ICD-10-CM | POA: Diagnosis not present

## 2020-11-05 DIAGNOSIS — M62838 Other muscle spasm: Secondary | ICD-10-CM | POA: Diagnosis not present

## 2020-11-05 DIAGNOSIS — J301 Allergic rhinitis due to pollen: Secondary | ICD-10-CM | POA: Diagnosis not present

## 2020-11-05 DIAGNOSIS — M545 Low back pain, unspecified: Secondary | ICD-10-CM | POA: Diagnosis not present

## 2020-11-12 DIAGNOSIS — J301 Allergic rhinitis due to pollen: Secondary | ICD-10-CM | POA: Diagnosis not present

## 2020-11-12 DIAGNOSIS — M6289 Other specified disorders of muscle: Secondary | ICD-10-CM | POA: Diagnosis not present

## 2020-11-12 DIAGNOSIS — R102 Pelvic and perineal pain: Secondary | ICD-10-CM | POA: Diagnosis not present

## 2020-11-12 DIAGNOSIS — J3089 Other allergic rhinitis: Secondary | ICD-10-CM | POA: Diagnosis not present

## 2020-11-12 DIAGNOSIS — M62838 Other muscle spasm: Secondary | ICD-10-CM | POA: Diagnosis not present

## 2020-11-12 DIAGNOSIS — J3081 Allergic rhinitis due to animal (cat) (dog) hair and dander: Secondary | ICD-10-CM | POA: Diagnosis not present

## 2020-11-12 DIAGNOSIS — M6281 Muscle weakness (generalized): Secondary | ICD-10-CM | POA: Diagnosis not present

## 2020-11-14 DIAGNOSIS — J301 Allergic rhinitis due to pollen: Secondary | ICD-10-CM | POA: Diagnosis not present

## 2020-11-16 DIAGNOSIS — J3089 Other allergic rhinitis: Secondary | ICD-10-CM | POA: Diagnosis not present

## 2020-11-16 DIAGNOSIS — J3081 Allergic rhinitis due to animal (cat) (dog) hair and dander: Secondary | ICD-10-CM | POA: Diagnosis not present

## 2020-11-19 DIAGNOSIS — J3081 Allergic rhinitis due to animal (cat) (dog) hair and dander: Secondary | ICD-10-CM | POA: Diagnosis not present

## 2020-11-19 DIAGNOSIS — R3914 Feeling of incomplete bladder emptying: Secondary | ICD-10-CM | POA: Diagnosis not present

## 2020-11-19 DIAGNOSIS — R102 Pelvic and perineal pain: Secondary | ICD-10-CM | POA: Diagnosis not present

## 2020-11-19 DIAGNOSIS — J3089 Other allergic rhinitis: Secondary | ICD-10-CM | POA: Diagnosis not present

## 2020-11-19 DIAGNOSIS — R350 Frequency of micturition: Secondary | ICD-10-CM | POA: Diagnosis not present

## 2020-11-19 DIAGNOSIS — R1030 Lower abdominal pain, unspecified: Secondary | ICD-10-CM | POA: Diagnosis not present

## 2020-11-19 DIAGNOSIS — J301 Allergic rhinitis due to pollen: Secondary | ICD-10-CM | POA: Diagnosis not present

## 2020-11-26 DIAGNOSIS — J3089 Other allergic rhinitis: Secondary | ICD-10-CM | POA: Diagnosis not present

## 2020-11-26 DIAGNOSIS — J301 Allergic rhinitis due to pollen: Secondary | ICD-10-CM | POA: Diagnosis not present

## 2020-11-26 DIAGNOSIS — J3081 Allergic rhinitis due to animal (cat) (dog) hair and dander: Secondary | ICD-10-CM | POA: Diagnosis not present

## 2020-12-03 DIAGNOSIS — M6281 Muscle weakness (generalized): Secondary | ICD-10-CM | POA: Diagnosis not present

## 2020-12-03 DIAGNOSIS — M6289 Other specified disorders of muscle: Secondary | ICD-10-CM | POA: Diagnosis not present

## 2020-12-03 DIAGNOSIS — R102 Pelvic and perineal pain: Secondary | ICD-10-CM | POA: Diagnosis not present

## 2020-12-03 DIAGNOSIS — J3081 Allergic rhinitis due to animal (cat) (dog) hair and dander: Secondary | ICD-10-CM | POA: Diagnosis not present

## 2020-12-03 DIAGNOSIS — J301 Allergic rhinitis due to pollen: Secondary | ICD-10-CM | POA: Diagnosis not present

## 2020-12-03 DIAGNOSIS — J3089 Other allergic rhinitis: Secondary | ICD-10-CM | POA: Diagnosis not present

## 2020-12-03 DIAGNOSIS — M62838 Other muscle spasm: Secondary | ICD-10-CM | POA: Diagnosis not present

## 2020-12-10 DIAGNOSIS — J3081 Allergic rhinitis due to animal (cat) (dog) hair and dander: Secondary | ICD-10-CM | POA: Diagnosis not present

## 2020-12-10 DIAGNOSIS — J3089 Other allergic rhinitis: Secondary | ICD-10-CM | POA: Diagnosis not present

## 2020-12-10 DIAGNOSIS — J301 Allergic rhinitis due to pollen: Secondary | ICD-10-CM | POA: Diagnosis not present

## 2020-12-17 DIAGNOSIS — J3081 Allergic rhinitis due to animal (cat) (dog) hair and dander: Secondary | ICD-10-CM | POA: Diagnosis not present

## 2020-12-17 DIAGNOSIS — J3089 Other allergic rhinitis: Secondary | ICD-10-CM | POA: Diagnosis not present

## 2020-12-17 DIAGNOSIS — J301 Allergic rhinitis due to pollen: Secondary | ICD-10-CM | POA: Diagnosis not present

## 2020-12-24 DIAGNOSIS — N926 Irregular menstruation, unspecified: Secondary | ICD-10-CM | POA: Diagnosis not present

## 2020-12-24 DIAGNOSIS — Z01419 Encounter for gynecological examination (general) (routine) without abnormal findings: Secondary | ICD-10-CM | POA: Diagnosis not present

## 2020-12-24 DIAGNOSIS — Z6830 Body mass index (BMI) 30.0-30.9, adult: Secondary | ICD-10-CM | POA: Diagnosis not present

## 2020-12-25 DIAGNOSIS — M6289 Other specified disorders of muscle: Secondary | ICD-10-CM | POA: Diagnosis not present

## 2020-12-25 DIAGNOSIS — R102 Pelvic and perineal pain: Secondary | ICD-10-CM | POA: Diagnosis not present

## 2020-12-25 DIAGNOSIS — M6281 Muscle weakness (generalized): Secondary | ICD-10-CM | POA: Diagnosis not present

## 2020-12-25 DIAGNOSIS — J3089 Other allergic rhinitis: Secondary | ICD-10-CM | POA: Diagnosis not present

## 2020-12-25 DIAGNOSIS — J301 Allergic rhinitis due to pollen: Secondary | ICD-10-CM | POA: Diagnosis not present

## 2020-12-25 DIAGNOSIS — M62838 Other muscle spasm: Secondary | ICD-10-CM | POA: Diagnosis not present

## 2020-12-25 DIAGNOSIS — J3081 Allergic rhinitis due to animal (cat) (dog) hair and dander: Secondary | ICD-10-CM | POA: Diagnosis not present

## 2020-12-29 DIAGNOSIS — M542 Cervicalgia: Secondary | ICD-10-CM | POA: Diagnosis not present

## 2020-12-29 DIAGNOSIS — M6281 Muscle weakness (generalized): Secondary | ICD-10-CM | POA: Diagnosis not present

## 2020-12-29 DIAGNOSIS — N926 Irregular menstruation, unspecified: Secondary | ICD-10-CM | POA: Diagnosis not present

## 2020-12-29 DIAGNOSIS — M62838 Other muscle spasm: Secondary | ICD-10-CM | POA: Diagnosis not present

## 2020-12-29 DIAGNOSIS — M26603 Bilateral temporomandibular joint disorder, unspecified: Secondary | ICD-10-CM | POA: Diagnosis not present

## 2021-01-01 DIAGNOSIS — J3081 Allergic rhinitis due to animal (cat) (dog) hair and dander: Secondary | ICD-10-CM | POA: Diagnosis not present

## 2021-01-01 DIAGNOSIS — J3089 Other allergic rhinitis: Secondary | ICD-10-CM | POA: Diagnosis not present

## 2021-01-01 DIAGNOSIS — J301 Allergic rhinitis due to pollen: Secondary | ICD-10-CM | POA: Diagnosis not present

## 2021-01-08 DIAGNOSIS — J3089 Other allergic rhinitis: Secondary | ICD-10-CM | POA: Diagnosis not present

## 2021-01-08 DIAGNOSIS — J3081 Allergic rhinitis due to animal (cat) (dog) hair and dander: Secondary | ICD-10-CM | POA: Diagnosis not present

## 2021-01-08 DIAGNOSIS — J301 Allergic rhinitis due to pollen: Secondary | ICD-10-CM | POA: Diagnosis not present

## 2021-01-19 DIAGNOSIS — R102 Pelvic and perineal pain: Secondary | ICD-10-CM | POA: Diagnosis not present

## 2021-01-19 DIAGNOSIS — J301 Allergic rhinitis due to pollen: Secondary | ICD-10-CM | POA: Diagnosis not present

## 2021-01-19 DIAGNOSIS — M542 Cervicalgia: Secondary | ICD-10-CM | POA: Diagnosis not present

## 2021-01-19 DIAGNOSIS — J3089 Other allergic rhinitis: Secondary | ICD-10-CM | POA: Diagnosis not present

## 2021-01-19 DIAGNOSIS — M6281 Muscle weakness (generalized): Secondary | ICD-10-CM | POA: Diagnosis not present

## 2021-01-19 DIAGNOSIS — J3081 Allergic rhinitis due to animal (cat) (dog) hair and dander: Secondary | ICD-10-CM | POA: Diagnosis not present

## 2021-01-19 DIAGNOSIS — M545 Low back pain, unspecified: Secondary | ICD-10-CM | POA: Diagnosis not present

## 2021-01-28 DIAGNOSIS — J3081 Allergic rhinitis due to animal (cat) (dog) hair and dander: Secondary | ICD-10-CM | POA: Diagnosis not present

## 2021-01-28 DIAGNOSIS — J301 Allergic rhinitis due to pollen: Secondary | ICD-10-CM | POA: Diagnosis not present

## 2021-01-28 DIAGNOSIS — J3089 Other allergic rhinitis: Secondary | ICD-10-CM | POA: Diagnosis not present

## 2021-02-04 DIAGNOSIS — J301 Allergic rhinitis due to pollen: Secondary | ICD-10-CM | POA: Diagnosis not present

## 2021-02-04 DIAGNOSIS — J3089 Other allergic rhinitis: Secondary | ICD-10-CM | POA: Diagnosis not present

## 2021-02-04 DIAGNOSIS — J3081 Allergic rhinitis due to animal (cat) (dog) hair and dander: Secondary | ICD-10-CM | POA: Diagnosis not present

## 2021-02-09 DIAGNOSIS — M6289 Other specified disorders of muscle: Secondary | ICD-10-CM | POA: Diagnosis not present

## 2021-02-09 DIAGNOSIS — J301 Allergic rhinitis due to pollen: Secondary | ICD-10-CM | POA: Diagnosis not present

## 2021-02-09 DIAGNOSIS — M545 Low back pain, unspecified: Secondary | ICD-10-CM | POA: Diagnosis not present

## 2021-02-09 DIAGNOSIS — M6281 Muscle weakness (generalized): Secondary | ICD-10-CM | POA: Diagnosis not present

## 2021-02-09 DIAGNOSIS — J3089 Other allergic rhinitis: Secondary | ICD-10-CM | POA: Diagnosis not present

## 2021-02-09 DIAGNOSIS — J3081 Allergic rhinitis due to animal (cat) (dog) hair and dander: Secondary | ICD-10-CM | POA: Diagnosis not present

## 2021-02-09 DIAGNOSIS — M62838 Other muscle spasm: Secondary | ICD-10-CM | POA: Diagnosis not present

## 2021-02-18 DIAGNOSIS — D485 Neoplasm of uncertain behavior of skin: Secondary | ICD-10-CM | POA: Diagnosis not present

## 2021-02-18 DIAGNOSIS — D2271 Melanocytic nevi of right lower limb, including hip: Secondary | ICD-10-CM | POA: Diagnosis not present

## 2021-02-18 DIAGNOSIS — J3089 Other allergic rhinitis: Secondary | ICD-10-CM | POA: Diagnosis not present

## 2021-02-18 DIAGNOSIS — J3081 Allergic rhinitis due to animal (cat) (dog) hair and dander: Secondary | ICD-10-CM | POA: Diagnosis not present

## 2021-02-18 DIAGNOSIS — J301 Allergic rhinitis due to pollen: Secondary | ICD-10-CM | POA: Diagnosis not present

## 2021-02-26 DIAGNOSIS — J301 Allergic rhinitis due to pollen: Secondary | ICD-10-CM | POA: Diagnosis not present

## 2021-02-26 DIAGNOSIS — J3081 Allergic rhinitis due to animal (cat) (dog) hair and dander: Secondary | ICD-10-CM | POA: Diagnosis not present

## 2021-02-26 DIAGNOSIS — J3089 Other allergic rhinitis: Secondary | ICD-10-CM | POA: Diagnosis not present

## 2021-03-02 DIAGNOSIS — J3089 Other allergic rhinitis: Secondary | ICD-10-CM | POA: Diagnosis not present

## 2021-03-02 DIAGNOSIS — M6281 Muscle weakness (generalized): Secondary | ICD-10-CM | POA: Diagnosis not present

## 2021-03-02 DIAGNOSIS — R109 Unspecified abdominal pain: Secondary | ICD-10-CM | POA: Diagnosis not present

## 2021-03-02 DIAGNOSIS — J3081 Allergic rhinitis due to animal (cat) (dog) hair and dander: Secondary | ICD-10-CM | POA: Diagnosis not present

## 2021-03-02 DIAGNOSIS — R102 Pelvic and perineal pain: Secondary | ICD-10-CM | POA: Diagnosis not present

## 2021-03-02 DIAGNOSIS — M542 Cervicalgia: Secondary | ICD-10-CM | POA: Diagnosis not present

## 2021-03-02 DIAGNOSIS — J301 Allergic rhinitis due to pollen: Secondary | ICD-10-CM | POA: Diagnosis not present

## 2021-03-23 DIAGNOSIS — J3081 Allergic rhinitis due to animal (cat) (dog) hair and dander: Secondary | ICD-10-CM | POA: Diagnosis not present

## 2021-03-23 DIAGNOSIS — J301 Allergic rhinitis due to pollen: Secondary | ICD-10-CM | POA: Diagnosis not present

## 2021-03-23 DIAGNOSIS — J3089 Other allergic rhinitis: Secondary | ICD-10-CM | POA: Diagnosis not present

## 2021-03-30 DIAGNOSIS — J3081 Allergic rhinitis due to animal (cat) (dog) hair and dander: Secondary | ICD-10-CM | POA: Diagnosis not present

## 2021-03-30 DIAGNOSIS — R109 Unspecified abdominal pain: Secondary | ICD-10-CM | POA: Diagnosis not present

## 2021-03-30 DIAGNOSIS — R102 Pelvic and perineal pain: Secondary | ICD-10-CM | POA: Diagnosis not present

## 2021-03-30 DIAGNOSIS — J3089 Other allergic rhinitis: Secondary | ICD-10-CM | POA: Diagnosis not present

## 2021-03-30 DIAGNOSIS — J301 Allergic rhinitis due to pollen: Secondary | ICD-10-CM | POA: Diagnosis not present

## 2021-03-30 DIAGNOSIS — R1030 Lower abdominal pain, unspecified: Secondary | ICD-10-CM | POA: Diagnosis not present

## 2021-03-30 DIAGNOSIS — M545 Low back pain, unspecified: Secondary | ICD-10-CM | POA: Diagnosis not present

## 2021-04-06 DIAGNOSIS — J3081 Allergic rhinitis due to animal (cat) (dog) hair and dander: Secondary | ICD-10-CM | POA: Diagnosis not present

## 2021-04-06 DIAGNOSIS — J3089 Other allergic rhinitis: Secondary | ICD-10-CM | POA: Diagnosis not present

## 2021-04-06 DIAGNOSIS — J301 Allergic rhinitis due to pollen: Secondary | ICD-10-CM | POA: Diagnosis not present

## 2021-04-13 DIAGNOSIS — J301 Allergic rhinitis due to pollen: Secondary | ICD-10-CM | POA: Diagnosis not present

## 2021-04-13 DIAGNOSIS — J3089 Other allergic rhinitis: Secondary | ICD-10-CM | POA: Diagnosis not present

## 2021-04-13 DIAGNOSIS — J3081 Allergic rhinitis due to animal (cat) (dog) hair and dander: Secondary | ICD-10-CM | POA: Diagnosis not present

## 2021-04-19 DIAGNOSIS — J301 Allergic rhinitis due to pollen: Secondary | ICD-10-CM | POA: Diagnosis not present

## 2021-04-19 DIAGNOSIS — J3089 Other allergic rhinitis: Secondary | ICD-10-CM | POA: Diagnosis not present

## 2021-04-19 DIAGNOSIS — R102 Pelvic and perineal pain: Secondary | ICD-10-CM | POA: Diagnosis not present

## 2021-04-19 DIAGNOSIS — M62838 Other muscle spasm: Secondary | ICD-10-CM | POA: Diagnosis not present

## 2021-04-19 DIAGNOSIS — M6289 Other specified disorders of muscle: Secondary | ICD-10-CM | POA: Diagnosis not present

## 2021-04-19 DIAGNOSIS — J3081 Allergic rhinitis due to animal (cat) (dog) hair and dander: Secondary | ICD-10-CM | POA: Diagnosis not present

## 2021-04-19 DIAGNOSIS — M6281 Muscle weakness (generalized): Secondary | ICD-10-CM | POA: Diagnosis not present

## 2021-04-29 DIAGNOSIS — J3089 Other allergic rhinitis: Secondary | ICD-10-CM | POA: Diagnosis not present

## 2021-04-29 DIAGNOSIS — J3081 Allergic rhinitis due to animal (cat) (dog) hair and dander: Secondary | ICD-10-CM | POA: Diagnosis not present

## 2021-04-29 DIAGNOSIS — J301 Allergic rhinitis due to pollen: Secondary | ICD-10-CM | POA: Diagnosis not present

## 2021-05-07 DIAGNOSIS — J3089 Other allergic rhinitis: Secondary | ICD-10-CM | POA: Diagnosis not present

## 2021-05-07 DIAGNOSIS — J3081 Allergic rhinitis due to animal (cat) (dog) hair and dander: Secondary | ICD-10-CM | POA: Diagnosis not present

## 2021-05-07 DIAGNOSIS — J301 Allergic rhinitis due to pollen: Secondary | ICD-10-CM | POA: Diagnosis not present

## 2021-05-14 DIAGNOSIS — R3914 Feeling of incomplete bladder emptying: Secondary | ICD-10-CM | POA: Diagnosis not present

## 2021-05-14 DIAGNOSIS — M542 Cervicalgia: Secondary | ICD-10-CM | POA: Diagnosis not present

## 2021-05-14 DIAGNOSIS — J301 Allergic rhinitis due to pollen: Secondary | ICD-10-CM | POA: Diagnosis not present

## 2021-05-14 DIAGNOSIS — J3081 Allergic rhinitis due to animal (cat) (dog) hair and dander: Secondary | ICD-10-CM | POA: Diagnosis not present

## 2021-05-14 DIAGNOSIS — M545 Low back pain, unspecified: Secondary | ICD-10-CM | POA: Diagnosis not present

## 2021-05-14 DIAGNOSIS — M62838 Other muscle spasm: Secondary | ICD-10-CM | POA: Diagnosis not present

## 2021-05-14 DIAGNOSIS — J3089 Other allergic rhinitis: Secondary | ICD-10-CM | POA: Diagnosis not present

## 2021-05-21 DIAGNOSIS — J3081 Allergic rhinitis due to animal (cat) (dog) hair and dander: Secondary | ICD-10-CM | POA: Diagnosis not present

## 2021-05-21 DIAGNOSIS — J301 Allergic rhinitis due to pollen: Secondary | ICD-10-CM | POA: Diagnosis not present

## 2021-05-21 DIAGNOSIS — J3089 Other allergic rhinitis: Secondary | ICD-10-CM | POA: Diagnosis not present

## 2021-06-04 DIAGNOSIS — R3914 Feeling of incomplete bladder emptying: Secondary | ICD-10-CM | POA: Diagnosis not present

## 2021-06-04 DIAGNOSIS — R1030 Lower abdominal pain, unspecified: Secondary | ICD-10-CM | POA: Diagnosis not present

## 2021-06-04 DIAGNOSIS — N3010 Interstitial cystitis (chronic) without hematuria: Secondary | ICD-10-CM | POA: Diagnosis not present

## 2021-06-04 DIAGNOSIS — N3000 Acute cystitis without hematuria: Secondary | ICD-10-CM | POA: Diagnosis not present

## 2021-06-04 DIAGNOSIS — J3089 Other allergic rhinitis: Secondary | ICD-10-CM | POA: Diagnosis not present

## 2021-06-04 DIAGNOSIS — J301 Allergic rhinitis due to pollen: Secondary | ICD-10-CM | POA: Diagnosis not present

## 2021-06-04 DIAGNOSIS — J3081 Allergic rhinitis due to animal (cat) (dog) hair and dander: Secondary | ICD-10-CM | POA: Diagnosis not present

## 2021-06-11 DIAGNOSIS — H1045 Other chronic allergic conjunctivitis: Secondary | ICD-10-CM | POA: Diagnosis not present

## 2021-06-11 DIAGNOSIS — J4530 Mild persistent asthma, uncomplicated: Secondary | ICD-10-CM | POA: Diagnosis not present

## 2021-06-11 DIAGNOSIS — J3081 Allergic rhinitis due to animal (cat) (dog) hair and dander: Secondary | ICD-10-CM | POA: Diagnosis not present

## 2021-06-11 DIAGNOSIS — J301 Allergic rhinitis due to pollen: Secondary | ICD-10-CM | POA: Diagnosis not present

## 2021-06-11 DIAGNOSIS — J3089 Other allergic rhinitis: Secondary | ICD-10-CM | POA: Diagnosis not present

## 2021-06-15 DIAGNOSIS — F4320 Adjustment disorder, unspecified: Secondary | ICD-10-CM | POA: Diagnosis not present

## 2021-06-18 DIAGNOSIS — J3081 Allergic rhinitis due to animal (cat) (dog) hair and dander: Secondary | ICD-10-CM | POA: Diagnosis not present

## 2021-06-18 DIAGNOSIS — J301 Allergic rhinitis due to pollen: Secondary | ICD-10-CM | POA: Diagnosis not present

## 2021-06-18 DIAGNOSIS — J3089 Other allergic rhinitis: Secondary | ICD-10-CM | POA: Diagnosis not present

## 2021-06-25 DIAGNOSIS — M6289 Other specified disorders of muscle: Secondary | ICD-10-CM | POA: Diagnosis not present

## 2021-06-25 DIAGNOSIS — J3081 Allergic rhinitis due to animal (cat) (dog) hair and dander: Secondary | ICD-10-CM | POA: Diagnosis not present

## 2021-06-25 DIAGNOSIS — M6281 Muscle weakness (generalized): Secondary | ICD-10-CM | POA: Diagnosis not present

## 2021-06-25 DIAGNOSIS — M62838 Other muscle spasm: Secondary | ICD-10-CM | POA: Diagnosis not present

## 2021-06-25 DIAGNOSIS — M545 Low back pain, unspecified: Secondary | ICD-10-CM | POA: Diagnosis not present

## 2021-06-25 DIAGNOSIS — J3089 Other allergic rhinitis: Secondary | ICD-10-CM | POA: Diagnosis not present

## 2021-06-25 DIAGNOSIS — J301 Allergic rhinitis due to pollen: Secondary | ICD-10-CM | POA: Diagnosis not present

## 2021-06-28 DIAGNOSIS — J301 Allergic rhinitis due to pollen: Secondary | ICD-10-CM | POA: Diagnosis not present

## 2021-06-29 DIAGNOSIS — J3081 Allergic rhinitis due to animal (cat) (dog) hair and dander: Secondary | ICD-10-CM | POA: Diagnosis not present

## 2021-06-29 DIAGNOSIS — J301 Allergic rhinitis due to pollen: Secondary | ICD-10-CM | POA: Diagnosis not present

## 2021-06-29 DIAGNOSIS — J3089 Other allergic rhinitis: Secondary | ICD-10-CM | POA: Diagnosis not present

## 2021-06-30 DIAGNOSIS — F4320 Adjustment disorder, unspecified: Secondary | ICD-10-CM | POA: Diagnosis not present

## 2021-07-08 DIAGNOSIS — F4320 Adjustment disorder, unspecified: Secondary | ICD-10-CM | POA: Diagnosis not present

## 2021-07-09 DIAGNOSIS — J3089 Other allergic rhinitis: Secondary | ICD-10-CM | POA: Diagnosis not present

## 2021-07-09 DIAGNOSIS — J301 Allergic rhinitis due to pollen: Secondary | ICD-10-CM | POA: Diagnosis not present

## 2021-07-09 DIAGNOSIS — J3081 Allergic rhinitis due to animal (cat) (dog) hair and dander: Secondary | ICD-10-CM | POA: Diagnosis not present

## 2021-07-12 DIAGNOSIS — J3089 Other allergic rhinitis: Secondary | ICD-10-CM | POA: Diagnosis not present

## 2021-07-12 DIAGNOSIS — J3081 Allergic rhinitis due to animal (cat) (dog) hair and dander: Secondary | ICD-10-CM | POA: Diagnosis not present

## 2021-07-12 DIAGNOSIS — F4320 Adjustment disorder, unspecified: Secondary | ICD-10-CM | POA: Diagnosis not present

## 2021-07-12 DIAGNOSIS — J301 Allergic rhinitis due to pollen: Secondary | ICD-10-CM | POA: Diagnosis not present

## 2021-07-19 DIAGNOSIS — J3089 Other allergic rhinitis: Secondary | ICD-10-CM | POA: Diagnosis not present

## 2021-07-19 DIAGNOSIS — J301 Allergic rhinitis due to pollen: Secondary | ICD-10-CM | POA: Diagnosis not present

## 2021-07-19 DIAGNOSIS — J3081 Allergic rhinitis due to animal (cat) (dog) hair and dander: Secondary | ICD-10-CM | POA: Diagnosis not present

## 2021-07-23 DIAGNOSIS — J301 Allergic rhinitis due to pollen: Secondary | ICD-10-CM | POA: Diagnosis not present

## 2021-07-23 DIAGNOSIS — J3089 Other allergic rhinitis: Secondary | ICD-10-CM | POA: Diagnosis not present

## 2021-07-23 DIAGNOSIS — J3081 Allergic rhinitis due to animal (cat) (dog) hair and dander: Secondary | ICD-10-CM | POA: Diagnosis not present

## 2021-07-27 DIAGNOSIS — J3089 Other allergic rhinitis: Secondary | ICD-10-CM | POA: Diagnosis not present

## 2021-07-27 DIAGNOSIS — J3081 Allergic rhinitis due to animal (cat) (dog) hair and dander: Secondary | ICD-10-CM | POA: Diagnosis not present

## 2021-07-27 DIAGNOSIS — J301 Allergic rhinitis due to pollen: Secondary | ICD-10-CM | POA: Diagnosis not present

## 2021-07-28 DIAGNOSIS — F4320 Adjustment disorder, unspecified: Secondary | ICD-10-CM | POA: Diagnosis not present

## 2021-07-29 DIAGNOSIS — J3089 Other allergic rhinitis: Secondary | ICD-10-CM | POA: Diagnosis not present

## 2021-07-29 DIAGNOSIS — M62838 Other muscle spasm: Secondary | ICD-10-CM | POA: Diagnosis not present

## 2021-07-29 DIAGNOSIS — M545 Low back pain, unspecified: Secondary | ICD-10-CM | POA: Diagnosis not present

## 2021-07-29 DIAGNOSIS — M6281 Muscle weakness (generalized): Secondary | ICD-10-CM | POA: Diagnosis not present

## 2021-07-29 DIAGNOSIS — J301 Allergic rhinitis due to pollen: Secondary | ICD-10-CM | POA: Diagnosis not present

## 2021-07-29 DIAGNOSIS — J3081 Allergic rhinitis due to animal (cat) (dog) hair and dander: Secondary | ICD-10-CM | POA: Diagnosis not present

## 2021-07-29 DIAGNOSIS — R102 Pelvic and perineal pain: Secondary | ICD-10-CM | POA: Diagnosis not present

## 2021-08-06 DIAGNOSIS — J3081 Allergic rhinitis due to animal (cat) (dog) hair and dander: Secondary | ICD-10-CM | POA: Diagnosis not present

## 2021-08-06 DIAGNOSIS — J301 Allergic rhinitis due to pollen: Secondary | ICD-10-CM | POA: Diagnosis not present

## 2021-08-06 DIAGNOSIS — J3089 Other allergic rhinitis: Secondary | ICD-10-CM | POA: Diagnosis not present

## 2021-08-09 DIAGNOSIS — J3081 Allergic rhinitis due to animal (cat) (dog) hair and dander: Secondary | ICD-10-CM | POA: Diagnosis not present

## 2021-08-09 DIAGNOSIS — J301 Allergic rhinitis due to pollen: Secondary | ICD-10-CM | POA: Diagnosis not present

## 2021-08-09 DIAGNOSIS — F4320 Adjustment disorder, unspecified: Secondary | ICD-10-CM | POA: Diagnosis not present

## 2021-08-09 DIAGNOSIS — J3089 Other allergic rhinitis: Secondary | ICD-10-CM | POA: Diagnosis not present

## 2021-08-19 DIAGNOSIS — J3089 Other allergic rhinitis: Secondary | ICD-10-CM | POA: Diagnosis not present

## 2021-08-19 DIAGNOSIS — M6289 Other specified disorders of muscle: Secondary | ICD-10-CM | POA: Diagnosis not present

## 2021-08-19 DIAGNOSIS — J3081 Allergic rhinitis due to animal (cat) (dog) hair and dander: Secondary | ICD-10-CM | POA: Diagnosis not present

## 2021-08-19 DIAGNOSIS — J301 Allergic rhinitis due to pollen: Secondary | ICD-10-CM | POA: Diagnosis not present

## 2021-08-19 DIAGNOSIS — M62838 Other muscle spasm: Secondary | ICD-10-CM | POA: Diagnosis not present

## 2021-08-19 DIAGNOSIS — M6281 Muscle weakness (generalized): Secondary | ICD-10-CM | POA: Diagnosis not present

## 2021-08-19 DIAGNOSIS — R102 Pelvic and perineal pain: Secondary | ICD-10-CM | POA: Diagnosis not present

## 2021-08-24 DIAGNOSIS — F4320 Adjustment disorder, unspecified: Secondary | ICD-10-CM | POA: Diagnosis not present

## 2021-08-27 DIAGNOSIS — M62838 Other muscle spasm: Secondary | ICD-10-CM | POA: Diagnosis not present

## 2021-08-27 DIAGNOSIS — M6281 Muscle weakness (generalized): Secondary | ICD-10-CM | POA: Diagnosis not present

## 2021-08-27 DIAGNOSIS — J3081 Allergic rhinitis due to animal (cat) (dog) hair and dander: Secondary | ICD-10-CM | POA: Diagnosis not present

## 2021-08-27 DIAGNOSIS — M6289 Other specified disorders of muscle: Secondary | ICD-10-CM | POA: Diagnosis not present

## 2021-08-27 DIAGNOSIS — J301 Allergic rhinitis due to pollen: Secondary | ICD-10-CM | POA: Diagnosis not present

## 2021-08-27 DIAGNOSIS — J3089 Other allergic rhinitis: Secondary | ICD-10-CM | POA: Diagnosis not present

## 2021-08-27 DIAGNOSIS — R102 Pelvic and perineal pain: Secondary | ICD-10-CM | POA: Diagnosis not present

## 2021-08-31 DIAGNOSIS — N3000 Acute cystitis without hematuria: Secondary | ICD-10-CM | POA: Diagnosis not present

## 2021-08-31 DIAGNOSIS — N3010 Interstitial cystitis (chronic) without hematuria: Secondary | ICD-10-CM | POA: Diagnosis not present

## 2021-09-07 DIAGNOSIS — F4320 Adjustment disorder, unspecified: Secondary | ICD-10-CM | POA: Diagnosis not present

## 2021-09-09 DIAGNOSIS — J3089 Other allergic rhinitis: Secondary | ICD-10-CM | POA: Diagnosis not present

## 2021-09-09 DIAGNOSIS — J3081 Allergic rhinitis due to animal (cat) (dog) hair and dander: Secondary | ICD-10-CM | POA: Diagnosis not present

## 2021-09-09 DIAGNOSIS — D2262 Melanocytic nevi of left upper limb, including shoulder: Secondary | ICD-10-CM | POA: Diagnosis not present

## 2021-09-09 DIAGNOSIS — J301 Allergic rhinitis due to pollen: Secondary | ICD-10-CM | POA: Diagnosis not present

## 2021-09-09 DIAGNOSIS — D225 Melanocytic nevi of trunk: Secondary | ICD-10-CM | POA: Diagnosis not present

## 2021-09-09 DIAGNOSIS — D1801 Hemangioma of skin and subcutaneous tissue: Secondary | ICD-10-CM | POA: Diagnosis not present

## 2021-09-09 DIAGNOSIS — D2271 Melanocytic nevi of right lower limb, including hip: Secondary | ICD-10-CM | POA: Diagnosis not present

## 2021-09-14 DIAGNOSIS — F4320 Adjustment disorder, unspecified: Secondary | ICD-10-CM | POA: Diagnosis not present

## 2021-09-17 DIAGNOSIS — J3081 Allergic rhinitis due to animal (cat) (dog) hair and dander: Secondary | ICD-10-CM | POA: Diagnosis not present

## 2021-09-17 DIAGNOSIS — J3089 Other allergic rhinitis: Secondary | ICD-10-CM | POA: Diagnosis not present

## 2021-09-17 DIAGNOSIS — J301 Allergic rhinitis due to pollen: Secondary | ICD-10-CM | POA: Diagnosis not present

## 2021-09-21 DIAGNOSIS — R310 Gross hematuria: Secondary | ICD-10-CM | POA: Diagnosis not present

## 2021-09-23 DIAGNOSIS — J3081 Allergic rhinitis due to animal (cat) (dog) hair and dander: Secondary | ICD-10-CM | POA: Diagnosis not present

## 2021-09-23 DIAGNOSIS — R102 Pelvic and perineal pain: Secondary | ICD-10-CM | POA: Diagnosis not present

## 2021-09-23 DIAGNOSIS — R1030 Lower abdominal pain, unspecified: Secondary | ICD-10-CM | POA: Diagnosis not present

## 2021-09-23 DIAGNOSIS — J301 Allergic rhinitis due to pollen: Secondary | ICD-10-CM | POA: Diagnosis not present

## 2021-09-23 DIAGNOSIS — R109 Unspecified abdominal pain: Secondary | ICD-10-CM | POA: Diagnosis not present

## 2021-09-23 DIAGNOSIS — N3010 Interstitial cystitis (chronic) without hematuria: Secondary | ICD-10-CM | POA: Diagnosis not present

## 2021-09-23 DIAGNOSIS — J3089 Other allergic rhinitis: Secondary | ICD-10-CM | POA: Diagnosis not present

## 2021-09-28 DIAGNOSIS — R319 Hematuria, unspecified: Secondary | ICD-10-CM | POA: Diagnosis not present

## 2021-09-28 DIAGNOSIS — R109 Unspecified abdominal pain: Secondary | ICD-10-CM | POA: Diagnosis not present

## 2021-09-28 DIAGNOSIS — R310 Gross hematuria: Secondary | ICD-10-CM | POA: Diagnosis not present

## 2021-09-30 DIAGNOSIS — M6289 Other specified disorders of muscle: Secondary | ICD-10-CM | POA: Diagnosis not present

## 2021-09-30 DIAGNOSIS — R102 Pelvic and perineal pain: Secondary | ICD-10-CM | POA: Diagnosis not present

## 2021-09-30 DIAGNOSIS — M6281 Muscle weakness (generalized): Secondary | ICD-10-CM | POA: Diagnosis not present

## 2021-09-30 DIAGNOSIS — F4320 Adjustment disorder, unspecified: Secondary | ICD-10-CM | POA: Diagnosis not present

## 2021-09-30 DIAGNOSIS — M62838 Other muscle spasm: Secondary | ICD-10-CM | POA: Diagnosis not present

## 2021-10-01 DIAGNOSIS — J3089 Other allergic rhinitis: Secondary | ICD-10-CM | POA: Diagnosis not present

## 2021-10-01 DIAGNOSIS — J3081 Allergic rhinitis due to animal (cat) (dog) hair and dander: Secondary | ICD-10-CM | POA: Diagnosis not present

## 2021-10-01 DIAGNOSIS — J301 Allergic rhinitis due to pollen: Secondary | ICD-10-CM | POA: Diagnosis not present

## 2021-10-07 DIAGNOSIS — M62838 Other muscle spasm: Secondary | ICD-10-CM | POA: Diagnosis not present

## 2021-10-07 DIAGNOSIS — M6289 Other specified disorders of muscle: Secondary | ICD-10-CM | POA: Diagnosis not present

## 2021-10-07 DIAGNOSIS — R102 Pelvic and perineal pain: Secondary | ICD-10-CM | POA: Diagnosis not present

## 2021-10-07 DIAGNOSIS — M6281 Muscle weakness (generalized): Secondary | ICD-10-CM | POA: Diagnosis not present

## 2021-10-13 DIAGNOSIS — R310 Gross hematuria: Secondary | ICD-10-CM | POA: Diagnosis not present

## 2021-10-15 DIAGNOSIS — J3089 Other allergic rhinitis: Secondary | ICD-10-CM | POA: Diagnosis not present

## 2021-10-15 DIAGNOSIS — J301 Allergic rhinitis due to pollen: Secondary | ICD-10-CM | POA: Diagnosis not present

## 2021-10-15 DIAGNOSIS — J3081 Allergic rhinitis due to animal (cat) (dog) hair and dander: Secondary | ICD-10-CM | POA: Diagnosis not present

## 2021-10-19 ENCOUNTER — Other Ambulatory Visit: Payer: Self-pay | Admitting: Family Medicine

## 2021-10-19 DIAGNOSIS — E010 Iodine-deficiency related diffuse (endemic) goiter: Secondary | ICD-10-CM

## 2021-10-19 DIAGNOSIS — E041 Nontoxic single thyroid nodule: Secondary | ICD-10-CM | POA: Diagnosis not present

## 2021-10-22 ENCOUNTER — Ambulatory Visit
Admission: RE | Admit: 2021-10-22 | Discharge: 2021-10-22 | Disposition: A | Discharge status: Home / Self Care | Payer: BC Managed Care – PPO | Source: Ambulatory Visit | Attending: Family Medicine | Admitting: Family Medicine

## 2021-10-22 DIAGNOSIS — E042 Nontoxic multinodular goiter: Secondary | ICD-10-CM | POA: Diagnosis not present

## 2021-10-22 DIAGNOSIS — E010 Iodine-deficiency related diffuse (endemic) goiter: Secondary | ICD-10-CM

## 2021-10-22 DIAGNOSIS — J301 Allergic rhinitis due to pollen: Secondary | ICD-10-CM | POA: Diagnosis not present

## 2021-10-22 DIAGNOSIS — J3081 Allergic rhinitis due to animal (cat) (dog) hair and dander: Secondary | ICD-10-CM | POA: Diagnosis not present

## 2021-10-22 DIAGNOSIS — J3089 Other allergic rhinitis: Secondary | ICD-10-CM | POA: Diagnosis not present

## 2021-10-25 DIAGNOSIS — M6281 Muscle weakness (generalized): Secondary | ICD-10-CM | POA: Diagnosis not present

## 2021-10-25 DIAGNOSIS — M62838 Other muscle spasm: Secondary | ICD-10-CM | POA: Diagnosis not present

## 2021-10-25 DIAGNOSIS — R102 Pelvic and perineal pain: Secondary | ICD-10-CM | POA: Diagnosis not present

## 2021-10-25 DIAGNOSIS — M6289 Other specified disorders of muscle: Secondary | ICD-10-CM | POA: Diagnosis not present

## 2021-10-28 DIAGNOSIS — F4320 Adjustment disorder, unspecified: Secondary | ICD-10-CM | POA: Diagnosis not present

## 2021-10-29 DIAGNOSIS — J3081 Allergic rhinitis due to animal (cat) (dog) hair and dander: Secondary | ICD-10-CM | POA: Diagnosis not present

## 2021-10-29 DIAGNOSIS — J3089 Other allergic rhinitis: Secondary | ICD-10-CM | POA: Diagnosis not present

## 2021-10-29 DIAGNOSIS — J301 Allergic rhinitis due to pollen: Secondary | ICD-10-CM | POA: Diagnosis not present

## 2021-11-05 DIAGNOSIS — J301 Allergic rhinitis due to pollen: Secondary | ICD-10-CM | POA: Diagnosis not present

## 2021-11-05 DIAGNOSIS — J3081 Allergic rhinitis due to animal (cat) (dog) hair and dander: Secondary | ICD-10-CM | POA: Diagnosis not present

## 2021-11-05 DIAGNOSIS — J3089 Other allergic rhinitis: Secondary | ICD-10-CM | POA: Diagnosis not present

## 2021-11-12 DIAGNOSIS — J301 Allergic rhinitis due to pollen: Secondary | ICD-10-CM | POA: Diagnosis not present

## 2021-11-12 DIAGNOSIS — J3089 Other allergic rhinitis: Secondary | ICD-10-CM | POA: Diagnosis not present

## 2021-11-12 DIAGNOSIS — J3081 Allergic rhinitis due to animal (cat) (dog) hair and dander: Secondary | ICD-10-CM | POA: Diagnosis not present

## 2021-11-19 DIAGNOSIS — J301 Allergic rhinitis due to pollen: Secondary | ICD-10-CM | POA: Diagnosis not present

## 2021-11-19 DIAGNOSIS — J3081 Allergic rhinitis due to animal (cat) (dog) hair and dander: Secondary | ICD-10-CM | POA: Diagnosis not present

## 2021-11-19 DIAGNOSIS — J3089 Other allergic rhinitis: Secondary | ICD-10-CM | POA: Diagnosis not present

## 2021-11-19 DIAGNOSIS — F4320 Adjustment disorder, unspecified: Secondary | ICD-10-CM | POA: Diagnosis not present

## 2021-11-23 DIAGNOSIS — J3089 Other allergic rhinitis: Secondary | ICD-10-CM | POA: Diagnosis not present

## 2021-11-24 DIAGNOSIS — J3081 Allergic rhinitis due to animal (cat) (dog) hair and dander: Secondary | ICD-10-CM | POA: Diagnosis not present

## 2021-11-24 DIAGNOSIS — J3089 Other allergic rhinitis: Secondary | ICD-10-CM | POA: Diagnosis not present

## 2021-11-25 DIAGNOSIS — N3010 Interstitial cystitis (chronic) without hematuria: Secondary | ICD-10-CM | POA: Diagnosis not present

## 2021-11-25 DIAGNOSIS — M6281 Muscle weakness (generalized): Secondary | ICD-10-CM | POA: Diagnosis not present

## 2021-11-25 DIAGNOSIS — M542 Cervicalgia: Secondary | ICD-10-CM | POA: Diagnosis not present

## 2021-11-25 DIAGNOSIS — R102 Pelvic and perineal pain: Secondary | ICD-10-CM | POA: Diagnosis not present

## 2021-11-25 DIAGNOSIS — J3081 Allergic rhinitis due to animal (cat) (dog) hair and dander: Secondary | ICD-10-CM | POA: Diagnosis not present

## 2021-11-25 DIAGNOSIS — J301 Allergic rhinitis due to pollen: Secondary | ICD-10-CM | POA: Diagnosis not present

## 2021-11-25 DIAGNOSIS — J3089 Other allergic rhinitis: Secondary | ICD-10-CM | POA: Diagnosis not present

## 2021-12-03 DIAGNOSIS — J3081 Allergic rhinitis due to animal (cat) (dog) hair and dander: Secondary | ICD-10-CM | POA: Diagnosis not present

## 2021-12-03 DIAGNOSIS — J3089 Other allergic rhinitis: Secondary | ICD-10-CM | POA: Diagnosis not present

## 2021-12-03 DIAGNOSIS — J301 Allergic rhinitis due to pollen: Secondary | ICD-10-CM | POA: Diagnosis not present

## 2021-12-10 DIAGNOSIS — J3089 Other allergic rhinitis: Secondary | ICD-10-CM | POA: Diagnosis not present

## 2021-12-10 DIAGNOSIS — J301 Allergic rhinitis due to pollen: Secondary | ICD-10-CM | POA: Diagnosis not present

## 2021-12-10 DIAGNOSIS — F4320 Adjustment disorder, unspecified: Secondary | ICD-10-CM | POA: Diagnosis not present

## 2021-12-10 DIAGNOSIS — J3081 Allergic rhinitis due to animal (cat) (dog) hair and dander: Secondary | ICD-10-CM | POA: Diagnosis not present

## 2021-12-15 DIAGNOSIS — J3089 Other allergic rhinitis: Secondary | ICD-10-CM | POA: Diagnosis not present

## 2021-12-15 DIAGNOSIS — J3081 Allergic rhinitis due to animal (cat) (dog) hair and dander: Secondary | ICD-10-CM | POA: Diagnosis not present

## 2021-12-15 DIAGNOSIS — M6289 Other specified disorders of muscle: Secondary | ICD-10-CM | POA: Diagnosis not present

## 2021-12-15 DIAGNOSIS — M6281 Muscle weakness (generalized): Secondary | ICD-10-CM | POA: Diagnosis not present

## 2021-12-15 DIAGNOSIS — M62838 Other muscle spasm: Secondary | ICD-10-CM | POA: Diagnosis not present

## 2021-12-15 DIAGNOSIS — J301 Allergic rhinitis due to pollen: Secondary | ICD-10-CM | POA: Diagnosis not present

## 2021-12-15 DIAGNOSIS — M545 Low back pain, unspecified: Secondary | ICD-10-CM | POA: Diagnosis not present

## 2021-12-24 DIAGNOSIS — J3089 Other allergic rhinitis: Secondary | ICD-10-CM | POA: Diagnosis not present

## 2021-12-24 DIAGNOSIS — J3081 Allergic rhinitis due to animal (cat) (dog) hair and dander: Secondary | ICD-10-CM | POA: Diagnosis not present

## 2021-12-24 DIAGNOSIS — J301 Allergic rhinitis due to pollen: Secondary | ICD-10-CM | POA: Diagnosis not present

## 2021-12-27 DIAGNOSIS — J301 Allergic rhinitis due to pollen: Secondary | ICD-10-CM | POA: Diagnosis not present

## 2021-12-27 DIAGNOSIS — J3089 Other allergic rhinitis: Secondary | ICD-10-CM | POA: Diagnosis not present

## 2021-12-27 DIAGNOSIS — J3081 Allergic rhinitis due to animal (cat) (dog) hair and dander: Secondary | ICD-10-CM | POA: Diagnosis not present

## 2021-12-30 DIAGNOSIS — J3089 Other allergic rhinitis: Secondary | ICD-10-CM | POA: Diagnosis not present

## 2021-12-30 DIAGNOSIS — Z1331 Encounter for screening for depression: Secondary | ICD-10-CM | POA: Diagnosis not present

## 2021-12-30 DIAGNOSIS — J301 Allergic rhinitis due to pollen: Secondary | ICD-10-CM | POA: Diagnosis not present

## 2021-12-30 DIAGNOSIS — J3081 Allergic rhinitis due to animal (cat) (dog) hair and dander: Secondary | ICD-10-CM | POA: Diagnosis not present

## 2021-12-30 DIAGNOSIS — Z8639 Personal history of other endocrine, nutritional and metabolic disease: Secondary | ICD-10-CM | POA: Diagnosis not present

## 2021-12-30 DIAGNOSIS — Z01419 Encounter for gynecological examination (general) (routine) without abnormal findings: Secondary | ICD-10-CM | POA: Diagnosis not present

## 2021-12-30 DIAGNOSIS — N926 Irregular menstruation, unspecified: Secondary | ICD-10-CM | POA: Diagnosis not present

## 2021-12-31 DIAGNOSIS — F4320 Adjustment disorder, unspecified: Secondary | ICD-10-CM | POA: Diagnosis not present

## 2022-01-07 DIAGNOSIS — J301 Allergic rhinitis due to pollen: Secondary | ICD-10-CM | POA: Diagnosis not present

## 2022-01-07 DIAGNOSIS — J3081 Allergic rhinitis due to animal (cat) (dog) hair and dander: Secondary | ICD-10-CM | POA: Diagnosis not present

## 2022-01-07 DIAGNOSIS — J3089 Other allergic rhinitis: Secondary | ICD-10-CM | POA: Diagnosis not present

## 2022-01-13 DIAGNOSIS — J3089 Other allergic rhinitis: Secondary | ICD-10-CM | POA: Diagnosis not present

## 2022-01-13 DIAGNOSIS — J3081 Allergic rhinitis due to animal (cat) (dog) hair and dander: Secondary | ICD-10-CM | POA: Diagnosis not present

## 2022-01-13 DIAGNOSIS — J301 Allergic rhinitis due to pollen: Secondary | ICD-10-CM | POA: Diagnosis not present

## 2022-01-18 DIAGNOSIS — J3081 Allergic rhinitis due to animal (cat) (dog) hair and dander: Secondary | ICD-10-CM | POA: Diagnosis not present

## 2022-01-18 DIAGNOSIS — J3089 Other allergic rhinitis: Secondary | ICD-10-CM | POA: Diagnosis not present

## 2022-01-18 DIAGNOSIS — J301 Allergic rhinitis due to pollen: Secondary | ICD-10-CM | POA: Diagnosis not present

## 2022-01-28 DIAGNOSIS — J3081 Allergic rhinitis due to animal (cat) (dog) hair and dander: Secondary | ICD-10-CM | POA: Diagnosis not present

## 2022-01-28 DIAGNOSIS — J301 Allergic rhinitis due to pollen: Secondary | ICD-10-CM | POA: Diagnosis not present

## 2022-01-28 DIAGNOSIS — J3089 Other allergic rhinitis: Secondary | ICD-10-CM | POA: Diagnosis not present

## 2022-01-31 DIAGNOSIS — F4320 Adjustment disorder, unspecified: Secondary | ICD-10-CM | POA: Diagnosis not present

## 2022-02-04 DIAGNOSIS — J3081 Allergic rhinitis due to animal (cat) (dog) hair and dander: Secondary | ICD-10-CM | POA: Diagnosis not present

## 2022-02-04 DIAGNOSIS — J301 Allergic rhinitis due to pollen: Secondary | ICD-10-CM | POA: Diagnosis not present

## 2022-02-04 DIAGNOSIS — J3089 Other allergic rhinitis: Secondary | ICD-10-CM | POA: Diagnosis not present

## 2022-02-11 DIAGNOSIS — J3081 Allergic rhinitis due to animal (cat) (dog) hair and dander: Secondary | ICD-10-CM | POA: Diagnosis not present

## 2022-02-11 DIAGNOSIS — J3089 Other allergic rhinitis: Secondary | ICD-10-CM | POA: Diagnosis not present

## 2022-02-11 DIAGNOSIS — J301 Allergic rhinitis due to pollen: Secondary | ICD-10-CM | POA: Diagnosis not present

## 2022-02-18 DIAGNOSIS — J3089 Other allergic rhinitis: Secondary | ICD-10-CM | POA: Diagnosis not present

## 2022-02-18 DIAGNOSIS — J301 Allergic rhinitis due to pollen: Secondary | ICD-10-CM | POA: Diagnosis not present

## 2022-02-18 DIAGNOSIS — J3081 Allergic rhinitis due to animal (cat) (dog) hair and dander: Secondary | ICD-10-CM | POA: Diagnosis not present

## 2022-02-18 DIAGNOSIS — F4320 Adjustment disorder, unspecified: Secondary | ICD-10-CM | POA: Diagnosis not present

## 2022-02-25 DIAGNOSIS — J3089 Other allergic rhinitis: Secondary | ICD-10-CM | POA: Diagnosis not present

## 2022-02-25 DIAGNOSIS — J3081 Allergic rhinitis due to animal (cat) (dog) hair and dander: Secondary | ICD-10-CM | POA: Diagnosis not present

## 2022-02-25 DIAGNOSIS — F4320 Adjustment disorder, unspecified: Secondary | ICD-10-CM | POA: Diagnosis not present

## 2022-02-25 DIAGNOSIS — J301 Allergic rhinitis due to pollen: Secondary | ICD-10-CM | POA: Diagnosis not present

## 2022-03-08 DIAGNOSIS — F4320 Adjustment disorder, unspecified: Secondary | ICD-10-CM | POA: Diagnosis not present

## 2022-03-09 DIAGNOSIS — J3089 Other allergic rhinitis: Secondary | ICD-10-CM | POA: Diagnosis not present

## 2022-03-09 DIAGNOSIS — J301 Allergic rhinitis due to pollen: Secondary | ICD-10-CM | POA: Diagnosis not present

## 2022-03-09 DIAGNOSIS — J3081 Allergic rhinitis due to animal (cat) (dog) hair and dander: Secondary | ICD-10-CM | POA: Diagnosis not present

## 2022-03-15 DIAGNOSIS — Z8759 Personal history of other complications of pregnancy, childbirth and the puerperium: Secondary | ICD-10-CM | POA: Diagnosis not present

## 2022-03-16 DIAGNOSIS — N926 Irregular menstruation, unspecified: Secondary | ICD-10-CM | POA: Diagnosis not present

## 2022-03-16 DIAGNOSIS — Z8759 Personal history of other complications of pregnancy, childbirth and the puerperium: Secondary | ICD-10-CM | POA: Diagnosis not present

## 2022-03-16 DIAGNOSIS — Z3200 Encounter for pregnancy test, result unknown: Secondary | ICD-10-CM | POA: Diagnosis not present

## 2022-03-18 DIAGNOSIS — J301 Allergic rhinitis due to pollen: Secondary | ICD-10-CM | POA: Diagnosis not present

## 2022-03-18 DIAGNOSIS — J3081 Allergic rhinitis due to animal (cat) (dog) hair and dander: Secondary | ICD-10-CM | POA: Diagnosis not present

## 2022-03-18 DIAGNOSIS — J3089 Other allergic rhinitis: Secondary | ICD-10-CM | POA: Diagnosis not present

## 2022-03-23 DIAGNOSIS — O09291 Supervision of pregnancy with other poor reproductive or obstetric history, first trimester: Secondary | ICD-10-CM | POA: Diagnosis not present

## 2022-03-25 DIAGNOSIS — J3089 Other allergic rhinitis: Secondary | ICD-10-CM | POA: Diagnosis not present

## 2022-03-25 DIAGNOSIS — F4320 Adjustment disorder, unspecified: Secondary | ICD-10-CM | POA: Diagnosis not present

## 2022-03-25 DIAGNOSIS — J3081 Allergic rhinitis due to animal (cat) (dog) hair and dander: Secondary | ICD-10-CM | POA: Diagnosis not present

## 2022-03-25 DIAGNOSIS — J301 Allergic rhinitis due to pollen: Secondary | ICD-10-CM | POA: Diagnosis not present

## 2022-03-28 DIAGNOSIS — O200 Threatened abortion: Secondary | ICD-10-CM | POA: Diagnosis not present

## 2022-03-28 DIAGNOSIS — M545 Low back pain, unspecified: Secondary | ICD-10-CM | POA: Diagnosis not present

## 2022-03-28 DIAGNOSIS — R7989 Other specified abnormal findings of blood chemistry: Secondary | ICD-10-CM | POA: Diagnosis not present

## 2022-03-30 DIAGNOSIS — M545 Low back pain, unspecified: Secondary | ICD-10-CM | POA: Diagnosis not present

## 2022-03-30 DIAGNOSIS — N9489 Other specified conditions associated with female genital organs and menstrual cycle: Secondary | ICD-10-CM | POA: Diagnosis not present

## 2022-03-30 DIAGNOSIS — Z8759 Personal history of other complications of pregnancy, childbirth and the puerperium: Secondary | ICD-10-CM | POA: Diagnosis not present

## 2022-03-30 DIAGNOSIS — O26891 Other specified pregnancy related conditions, first trimester: Secondary | ICD-10-CM | POA: Diagnosis not present

## 2022-03-30 DIAGNOSIS — O200 Threatened abortion: Secondary | ICD-10-CM | POA: Diagnosis not present

## 2022-03-31 DIAGNOSIS — R7989 Other specified abnormal findings of blood chemistry: Secondary | ICD-10-CM | POA: Diagnosis not present

## 2022-04-01 DIAGNOSIS — J3081 Allergic rhinitis due to animal (cat) (dog) hair and dander: Secondary | ICD-10-CM | POA: Diagnosis not present

## 2022-04-01 DIAGNOSIS — J301 Allergic rhinitis due to pollen: Secondary | ICD-10-CM | POA: Diagnosis not present

## 2022-04-01 DIAGNOSIS — J3089 Other allergic rhinitis: Secondary | ICD-10-CM | POA: Diagnosis not present

## 2022-04-04 DIAGNOSIS — R7989 Other specified abnormal findings of blood chemistry: Secondary | ICD-10-CM | POA: Diagnosis not present

## 2022-04-08 DIAGNOSIS — J3081 Allergic rhinitis due to animal (cat) (dog) hair and dander: Secondary | ICD-10-CM | POA: Diagnosis not present

## 2022-04-08 DIAGNOSIS — J3089 Other allergic rhinitis: Secondary | ICD-10-CM | POA: Diagnosis not present

## 2022-04-08 DIAGNOSIS — J301 Allergic rhinitis due to pollen: Secondary | ICD-10-CM | POA: Diagnosis not present

## 2022-04-08 DIAGNOSIS — F4320 Adjustment disorder, unspecified: Secondary | ICD-10-CM | POA: Diagnosis not present

## 2022-04-13 DIAGNOSIS — Z369 Encounter for antenatal screening, unspecified: Secondary | ICD-10-CM | POA: Diagnosis not present

## 2022-04-13 DIAGNOSIS — O09521 Supervision of elderly multigravida, first trimester: Secondary | ICD-10-CM | POA: Diagnosis not present

## 2022-04-13 DIAGNOSIS — O3680X0 Pregnancy with inconclusive fetal viability, not applicable or unspecified: Secondary | ICD-10-CM | POA: Diagnosis not present

## 2022-04-13 DIAGNOSIS — Z1589 Genetic susceptibility to other disease: Secondary | ICD-10-CM | POA: Diagnosis not present

## 2022-04-13 DIAGNOSIS — E010 Iodine-deficiency related diffuse (endemic) goiter: Secondary | ICD-10-CM | POA: Diagnosis not present

## 2022-04-13 DIAGNOSIS — M545 Low back pain, unspecified: Secondary | ICD-10-CM | POA: Diagnosis not present

## 2022-04-13 DIAGNOSIS — Z3491 Encounter for supervision of normal pregnancy, unspecified, first trimester: Secondary | ICD-10-CM | POA: Diagnosis not present

## 2022-04-13 DIAGNOSIS — Z8742 Personal history of other diseases of the female genital tract: Secondary | ICD-10-CM | POA: Diagnosis not present

## 2022-04-13 DIAGNOSIS — O0990 Supervision of high risk pregnancy, unspecified, unspecified trimester: Secondary | ICD-10-CM | POA: Diagnosis not present

## 2022-04-14 DIAGNOSIS — Z369 Encounter for antenatal screening, unspecified: Secondary | ICD-10-CM | POA: Diagnosis not present

## 2022-04-14 DIAGNOSIS — Z8742 Personal history of other diseases of the female genital tract: Secondary | ICD-10-CM | POA: Diagnosis not present

## 2022-04-14 DIAGNOSIS — O0990 Supervision of high risk pregnancy, unspecified, unspecified trimester: Secondary | ICD-10-CM | POA: Diagnosis not present

## 2022-04-15 DIAGNOSIS — J3081 Allergic rhinitis due to animal (cat) (dog) hair and dander: Secondary | ICD-10-CM | POA: Diagnosis not present

## 2022-04-15 DIAGNOSIS — J301 Allergic rhinitis due to pollen: Secondary | ICD-10-CM | POA: Diagnosis not present

## 2022-04-15 DIAGNOSIS — J3089 Other allergic rhinitis: Secondary | ICD-10-CM | POA: Diagnosis not present

## 2022-04-18 DIAGNOSIS — Z3A09 9 weeks gestation of pregnancy: Secondary | ICD-10-CM | POA: Diagnosis not present

## 2022-04-18 DIAGNOSIS — O99211 Obesity complicating pregnancy, first trimester: Secondary | ICD-10-CM | POA: Diagnosis not present

## 2022-04-19 DIAGNOSIS — E010 Iodine-deficiency related diffuse (endemic) goiter: Secondary | ICD-10-CM | POA: Diagnosis not present

## 2022-04-19 DIAGNOSIS — R7989 Other specified abnormal findings of blood chemistry: Secondary | ICD-10-CM | POA: Diagnosis not present

## 2022-04-20 DIAGNOSIS — F4320 Adjustment disorder, unspecified: Secondary | ICD-10-CM | POA: Diagnosis not present

## 2022-04-22 DIAGNOSIS — J3081 Allergic rhinitis due to animal (cat) (dog) hair and dander: Secondary | ICD-10-CM | POA: Diagnosis not present

## 2022-04-22 DIAGNOSIS — J301 Allergic rhinitis due to pollen: Secondary | ICD-10-CM | POA: Diagnosis not present

## 2022-04-22 DIAGNOSIS — J3089 Other allergic rhinitis: Secondary | ICD-10-CM | POA: Diagnosis not present

## 2022-04-27 DIAGNOSIS — E0590 Thyrotoxicosis, unspecified without thyrotoxic crisis or storm: Secondary | ICD-10-CM | POA: Diagnosis not present

## 2022-04-27 DIAGNOSIS — E010 Iodine-deficiency related diffuse (endemic) goiter: Secondary | ICD-10-CM | POA: Diagnosis not present

## 2022-04-27 DIAGNOSIS — O99280 Endocrine, nutritional and metabolic diseases complicating pregnancy, unspecified trimester: Secondary | ICD-10-CM | POA: Diagnosis not present

## 2022-04-29 DIAGNOSIS — J301 Allergic rhinitis due to pollen: Secondary | ICD-10-CM | POA: Diagnosis not present

## 2022-04-29 DIAGNOSIS — J3089 Other allergic rhinitis: Secondary | ICD-10-CM | POA: Diagnosis not present

## 2022-04-29 DIAGNOSIS — J3081 Allergic rhinitis due to animal (cat) (dog) hair and dander: Secondary | ICD-10-CM | POA: Diagnosis not present

## 2022-05-06 DIAGNOSIS — F4320 Adjustment disorder, unspecified: Secondary | ICD-10-CM | POA: Diagnosis not present

## 2022-05-09 DIAGNOSIS — J301 Allergic rhinitis due to pollen: Secondary | ICD-10-CM | POA: Diagnosis not present

## 2022-05-09 DIAGNOSIS — J3089 Other allergic rhinitis: Secondary | ICD-10-CM | POA: Diagnosis not present

## 2022-05-09 DIAGNOSIS — J3081 Allergic rhinitis due to animal (cat) (dog) hair and dander: Secondary | ICD-10-CM | POA: Diagnosis not present

## 2022-05-17 DIAGNOSIS — Z3A13 13 weeks gestation of pregnancy: Secondary | ICD-10-CM | POA: Diagnosis not present

## 2022-05-17 DIAGNOSIS — Z8759 Personal history of other complications of pregnancy, childbirth and the puerperium: Secondary | ICD-10-CM | POA: Diagnosis not present

## 2022-05-17 DIAGNOSIS — O34531 Maternal care for retroversion of gravid uterus, first trimester: Secondary | ICD-10-CM | POA: Diagnosis not present

## 2022-05-18 DIAGNOSIS — Z3A13 13 weeks gestation of pregnancy: Secondary | ICD-10-CM | POA: Diagnosis not present

## 2022-05-18 DIAGNOSIS — O34591 Maternal care for other abnormalities of gravid uterus, first trimester: Secondary | ICD-10-CM | POA: Diagnosis not present

## 2022-05-19 DIAGNOSIS — O34511 Maternal care for incarceration of gravid uterus, first trimester: Secondary | ICD-10-CM | POA: Diagnosis not present

## 2022-05-19 DIAGNOSIS — N854 Malposition of uterus: Secondary | ICD-10-CM | POA: Diagnosis not present

## 2022-05-19 DIAGNOSIS — J4530 Mild persistent asthma, uncomplicated: Secondary | ICD-10-CM | POA: Diagnosis not present

## 2022-05-19 DIAGNOSIS — K219 Gastro-esophageal reflux disease without esophagitis: Secondary | ICD-10-CM | POA: Diagnosis not present

## 2022-05-19 DIAGNOSIS — Z888 Allergy status to other drugs, medicaments and biological substances status: Secondary | ICD-10-CM | POA: Diagnosis not present

## 2022-05-19 DIAGNOSIS — Z3A13 13 weeks gestation of pregnancy: Secondary | ICD-10-CM | POA: Diagnosis not present

## 2022-05-19 DIAGNOSIS — Z79899 Other long term (current) drug therapy: Secondary | ICD-10-CM | POA: Diagnosis not present

## 2022-05-19 DIAGNOSIS — N858 Other specified noninflammatory disorders of uterus: Secondary | ICD-10-CM | POA: Diagnosis not present

## 2022-05-24 DIAGNOSIS — J3081 Allergic rhinitis due to animal (cat) (dog) hair and dander: Secondary | ICD-10-CM | POA: Diagnosis not present

## 2022-05-24 DIAGNOSIS — J3089 Other allergic rhinitis: Secondary | ICD-10-CM | POA: Diagnosis not present

## 2022-05-24 DIAGNOSIS — J301 Allergic rhinitis due to pollen: Secondary | ICD-10-CM | POA: Diagnosis not present

## 2022-05-25 DIAGNOSIS — E0590 Thyrotoxicosis, unspecified without thyrotoxic crisis or storm: Secondary | ICD-10-CM | POA: Diagnosis not present

## 2022-05-25 DIAGNOSIS — Z3A14 14 weeks gestation of pregnancy: Secondary | ICD-10-CM | POA: Diagnosis not present

## 2022-05-25 DIAGNOSIS — N398 Other specified disorders of urinary system: Secondary | ICD-10-CM | POA: Diagnosis not present

## 2022-05-25 DIAGNOSIS — N898 Other specified noninflammatory disorders of vagina: Secondary | ICD-10-CM | POA: Diagnosis not present

## 2022-05-25 DIAGNOSIS — O34512 Maternal care for incarceration of gravid uterus, second trimester: Secondary | ICD-10-CM | POA: Diagnosis not present

## 2022-05-25 DIAGNOSIS — Z8742 Personal history of other diseases of the female genital tract: Secondary | ICD-10-CM | POA: Diagnosis not present

## 2022-05-25 DIAGNOSIS — O99280 Endocrine, nutritional and metabolic diseases complicating pregnancy, unspecified trimester: Secondary | ICD-10-CM | POA: Diagnosis not present

## 2022-06-03 DIAGNOSIS — J3089 Other allergic rhinitis: Secondary | ICD-10-CM | POA: Diagnosis not present

## 2022-06-03 DIAGNOSIS — F4320 Adjustment disorder, unspecified: Secondary | ICD-10-CM | POA: Diagnosis not present

## 2022-06-03 DIAGNOSIS — J3081 Allergic rhinitis due to animal (cat) (dog) hair and dander: Secondary | ICD-10-CM | POA: Diagnosis not present

## 2022-06-03 DIAGNOSIS — J301 Allergic rhinitis due to pollen: Secondary | ICD-10-CM | POA: Diagnosis not present

## 2022-06-06 DIAGNOSIS — Z3A16 16 weeks gestation of pregnancy: Secondary | ICD-10-CM | POA: Diagnosis not present

## 2022-06-06 DIAGNOSIS — N854 Malposition of uterus: Secondary | ICD-10-CM | POA: Diagnosis not present

## 2022-06-06 DIAGNOSIS — R8271 Bacteriuria: Secondary | ICD-10-CM | POA: Diagnosis not present

## 2022-06-06 DIAGNOSIS — Z6831 Body mass index (BMI) 31.0-31.9, adult: Secondary | ICD-10-CM | POA: Diagnosis not present

## 2022-06-06 DIAGNOSIS — O0990 Supervision of high risk pregnancy, unspecified, unspecified trimester: Secondary | ICD-10-CM | POA: Diagnosis not present

## 2022-06-06 DIAGNOSIS — O4402 Placenta previa specified as without hemorrhage, second trimester: Secondary | ICD-10-CM | POA: Diagnosis not present

## 2022-06-10 DIAGNOSIS — H1045 Other chronic allergic conjunctivitis: Secondary | ICD-10-CM | POA: Diagnosis not present

## 2022-06-10 DIAGNOSIS — J4530 Mild persistent asthma, uncomplicated: Secondary | ICD-10-CM | POA: Diagnosis not present

## 2022-06-10 DIAGNOSIS — J3081 Allergic rhinitis due to animal (cat) (dog) hair and dander: Secondary | ICD-10-CM | POA: Diagnosis not present

## 2022-06-10 DIAGNOSIS — J301 Allergic rhinitis due to pollen: Secondary | ICD-10-CM | POA: Diagnosis not present

## 2022-06-10 DIAGNOSIS — J3089 Other allergic rhinitis: Secondary | ICD-10-CM | POA: Diagnosis not present

## 2022-06-13 DIAGNOSIS — F4320 Adjustment disorder, unspecified: Secondary | ICD-10-CM | POA: Diagnosis not present

## 2022-06-17 DIAGNOSIS — D485 Neoplasm of uncertain behavior of skin: Secondary | ICD-10-CM | POA: Diagnosis not present

## 2022-06-17 DIAGNOSIS — J301 Allergic rhinitis due to pollen: Secondary | ICD-10-CM | POA: Diagnosis not present

## 2022-06-17 DIAGNOSIS — D225 Melanocytic nevi of trunk: Secondary | ICD-10-CM | POA: Diagnosis not present

## 2022-06-17 DIAGNOSIS — J3089 Other allergic rhinitis: Secondary | ICD-10-CM | POA: Diagnosis not present

## 2022-06-17 DIAGNOSIS — J3081 Allergic rhinitis due to animal (cat) (dog) hair and dander: Secondary | ICD-10-CM | POA: Diagnosis not present

## 2022-06-20 DIAGNOSIS — O99212 Obesity complicating pregnancy, second trimester: Secondary | ICD-10-CM | POA: Diagnosis not present

## 2022-06-20 DIAGNOSIS — Z3A18 18 weeks gestation of pregnancy: Secondary | ICD-10-CM | POA: Diagnosis not present

## 2022-06-20 DIAGNOSIS — Z8759 Personal history of other complications of pregnancy, childbirth and the puerperium: Secondary | ICD-10-CM | POA: Diagnosis not present

## 2022-06-20 DIAGNOSIS — Z6831 Body mass index (BMI) 31.0-31.9, adult: Secondary | ICD-10-CM | POA: Diagnosis not present

## 2022-06-24 DIAGNOSIS — J3089 Other allergic rhinitis: Secondary | ICD-10-CM | POA: Diagnosis not present

## 2022-06-24 DIAGNOSIS — J3081 Allergic rhinitis due to animal (cat) (dog) hair and dander: Secondary | ICD-10-CM | POA: Diagnosis not present

## 2022-06-24 DIAGNOSIS — J301 Allergic rhinitis due to pollen: Secondary | ICD-10-CM | POA: Diagnosis not present

## 2022-06-27 DIAGNOSIS — F4320 Adjustment disorder, unspecified: Secondary | ICD-10-CM | POA: Diagnosis not present

## 2022-07-04 DIAGNOSIS — Z8751 Personal history of pre-term labor: Secondary | ICD-10-CM | POA: Diagnosis not present

## 2022-07-04 DIAGNOSIS — O99210 Obesity complicating pregnancy, unspecified trimester: Secondary | ICD-10-CM | POA: Diagnosis not present

## 2022-07-04 DIAGNOSIS — O4440 Low lying placenta NOS or without hemorrhage, unspecified trimester: Secondary | ICD-10-CM | POA: Diagnosis not present

## 2022-07-04 DIAGNOSIS — Z3A20 20 weeks gestation of pregnancy: Secondary | ICD-10-CM | POA: Diagnosis not present

## 2022-07-08 DIAGNOSIS — J3089 Other allergic rhinitis: Secondary | ICD-10-CM | POA: Diagnosis not present

## 2022-07-08 DIAGNOSIS — J3081 Allergic rhinitis due to animal (cat) (dog) hair and dander: Secondary | ICD-10-CM | POA: Diagnosis not present

## 2022-07-08 DIAGNOSIS — J301 Allergic rhinitis due to pollen: Secondary | ICD-10-CM | POA: Diagnosis not present

## 2022-07-12 DIAGNOSIS — F4320 Adjustment disorder, unspecified: Secondary | ICD-10-CM | POA: Diagnosis not present

## 2022-07-19 DIAGNOSIS — J301 Allergic rhinitis due to pollen: Secondary | ICD-10-CM | POA: Diagnosis not present

## 2022-07-19 DIAGNOSIS — J3081 Allergic rhinitis due to animal (cat) (dog) hair and dander: Secondary | ICD-10-CM | POA: Diagnosis not present

## 2022-07-19 DIAGNOSIS — J3089 Other allergic rhinitis: Secondary | ICD-10-CM | POA: Diagnosis not present

## 2022-07-20 DIAGNOSIS — O365990 Maternal care for other known or suspected poor fetal growth, unspecified trimester, not applicable or unspecified: Secondary | ICD-10-CM | POA: Diagnosis not present

## 2022-07-20 DIAGNOSIS — Z1589 Genetic susceptibility to other disease: Secondary | ICD-10-CM | POA: Diagnosis not present

## 2022-07-20 DIAGNOSIS — O4440 Low lying placenta NOS or without hemorrhage, unspecified trimester: Secondary | ICD-10-CM | POA: Diagnosis not present

## 2022-07-20 DIAGNOSIS — E669 Obesity, unspecified: Secondary | ICD-10-CM | POA: Diagnosis not present

## 2022-07-26 DIAGNOSIS — F4320 Adjustment disorder, unspecified: Secondary | ICD-10-CM | POA: Diagnosis not present

## 2022-07-27 DIAGNOSIS — J301 Allergic rhinitis due to pollen: Secondary | ICD-10-CM | POA: Diagnosis not present

## 2022-07-27 DIAGNOSIS — E0590 Thyrotoxicosis, unspecified without thyrotoxic crisis or storm: Secondary | ICD-10-CM | POA: Diagnosis not present

## 2022-07-27 DIAGNOSIS — J3081 Allergic rhinitis due to animal (cat) (dog) hair and dander: Secondary | ICD-10-CM | POA: Diagnosis not present

## 2022-07-27 DIAGNOSIS — O99280 Endocrine, nutritional and metabolic diseases complicating pregnancy, unspecified trimester: Secondary | ICD-10-CM | POA: Diagnosis not present

## 2022-07-27 DIAGNOSIS — E010 Iodine-deficiency related diffuse (endemic) goiter: Secondary | ICD-10-CM | POA: Diagnosis not present

## 2022-07-27 DIAGNOSIS — J3089 Other allergic rhinitis: Secondary | ICD-10-CM | POA: Diagnosis not present

## 2022-08-02 DIAGNOSIS — Z1152 Encounter for screening for COVID-19: Secondary | ICD-10-CM | POA: Diagnosis not present

## 2022-08-02 DIAGNOSIS — J4520 Mild intermittent asthma, uncomplicated: Secondary | ICD-10-CM | POA: Diagnosis not present

## 2022-08-02 DIAGNOSIS — Z3480 Encounter for supervision of other normal pregnancy, unspecified trimester: Secondary | ICD-10-CM | POA: Diagnosis not present

## 2022-08-02 DIAGNOSIS — R509 Fever, unspecified: Secondary | ICD-10-CM | POA: Diagnosis not present

## 2022-08-02 DIAGNOSIS — J101 Influenza due to other identified influenza virus with other respiratory manifestations: Secondary | ICD-10-CM | POA: Diagnosis not present

## 2022-08-02 DIAGNOSIS — O9962 Diseases of the digestive system complicating childbirth: Secondary | ICD-10-CM | POA: Diagnosis not present

## 2022-08-02 DIAGNOSIS — O364XX0 Maternal care for intrauterine death, not applicable or unspecified: Secondary | ICD-10-CM | POA: Diagnosis not present

## 2022-08-02 DIAGNOSIS — R059 Cough, unspecified: Secondary | ICD-10-CM | POA: Diagnosis not present

## 2022-08-02 DIAGNOSIS — Z91048 Other nonmedicinal substance allergy status: Secondary | ICD-10-CM | POA: Diagnosis not present

## 2022-08-02 DIAGNOSIS — Z3689 Encounter for other specified antenatal screening: Secondary | ICD-10-CM | POA: Diagnosis not present

## 2022-08-02 DIAGNOSIS — Z369 Encounter for antenatal screening, unspecified: Secondary | ICD-10-CM | POA: Diagnosis not present

## 2022-08-02 DIAGNOSIS — O99284 Endocrine, nutritional and metabolic diseases complicating childbirth: Secondary | ICD-10-CM | POA: Diagnosis not present

## 2022-08-02 DIAGNOSIS — Z3A24 24 weeks gestation of pregnancy: Secondary | ICD-10-CM | POA: Diagnosis not present

## 2022-08-02 DIAGNOSIS — Z7951 Long term (current) use of inhaled steroids: Secondary | ICD-10-CM | POA: Diagnosis not present

## 2022-08-02 DIAGNOSIS — O9952 Diseases of the respiratory system complicating childbirth: Secondary | ICD-10-CM | POA: Diagnosis not present

## 2022-08-02 DIAGNOSIS — K219 Gastro-esophageal reflux disease without esophagitis: Secondary | ICD-10-CM | POA: Diagnosis not present

## 2022-08-02 DIAGNOSIS — O365920 Maternal care for other known or suspected poor fetal growth, second trimester, not applicable or unspecified: Secondary | ICD-10-CM | POA: Diagnosis not present

## 2022-08-02 DIAGNOSIS — O4102X0 Oligohydramnios, second trimester, not applicable or unspecified: Secondary | ICD-10-CM | POA: Diagnosis not present

## 2022-08-02 DIAGNOSIS — Z113 Encounter for screening for infections with a predominantly sexual mode of transmission: Secondary | ICD-10-CM | POA: Diagnosis not present

## 2022-08-02 DIAGNOSIS — Z79899 Other long term (current) drug therapy: Secondary | ICD-10-CM | POA: Diagnosis not present

## 2022-08-02 DIAGNOSIS — E069 Thyroiditis, unspecified: Secondary | ICD-10-CM | POA: Diagnosis not present

## 2022-08-09 DIAGNOSIS — R102 Pelvic and perineal pain: Secondary | ICD-10-CM | POA: Diagnosis not present

## 2022-08-09 DIAGNOSIS — F4320 Adjustment disorder, unspecified: Secondary | ICD-10-CM | POA: Diagnosis not present

## 2022-08-09 DIAGNOSIS — Z8759 Personal history of other complications of pregnancy, childbirth and the puerperium: Secondary | ICD-10-CM | POA: Diagnosis not present

## 2022-08-09 DIAGNOSIS — R3915 Urgency of urination: Secondary | ICD-10-CM | POA: Diagnosis not present

## 2022-08-23 DIAGNOSIS — F4320 Adjustment disorder, unspecified: Secondary | ICD-10-CM | POA: Diagnosis not present

## 2022-08-29 DIAGNOSIS — O364XX0 Maternal care for intrauterine death, not applicable or unspecified: Secondary | ICD-10-CM | POA: Diagnosis not present

## 2022-08-30 DIAGNOSIS — F4320 Adjustment disorder, unspecified: Secondary | ICD-10-CM | POA: Diagnosis not present

## 2022-09-01 DIAGNOSIS — J3081 Allergic rhinitis due to animal (cat) (dog) hair and dander: Secondary | ICD-10-CM | POA: Diagnosis not present

## 2022-09-01 DIAGNOSIS — J301 Allergic rhinitis due to pollen: Secondary | ICD-10-CM | POA: Diagnosis not present

## 2022-09-01 DIAGNOSIS — J3089 Other allergic rhinitis: Secondary | ICD-10-CM | POA: Diagnosis not present

## 2022-09-06 DIAGNOSIS — J3081 Allergic rhinitis due to animal (cat) (dog) hair and dander: Secondary | ICD-10-CM | POA: Diagnosis not present

## 2022-09-06 DIAGNOSIS — F4320 Adjustment disorder, unspecified: Secondary | ICD-10-CM | POA: Diagnosis not present

## 2022-09-06 DIAGNOSIS — J3089 Other allergic rhinitis: Secondary | ICD-10-CM | POA: Diagnosis not present

## 2022-09-06 DIAGNOSIS — J301 Allergic rhinitis due to pollen: Secondary | ICD-10-CM | POA: Diagnosis not present

## 2022-09-12 DIAGNOSIS — J301 Allergic rhinitis due to pollen: Secondary | ICD-10-CM | POA: Diagnosis not present

## 2022-09-12 DIAGNOSIS — J3089 Other allergic rhinitis: Secondary | ICD-10-CM | POA: Diagnosis not present

## 2022-09-12 DIAGNOSIS — J3081 Allergic rhinitis due to animal (cat) (dog) hair and dander: Secondary | ICD-10-CM | POA: Diagnosis not present

## 2022-09-19 DIAGNOSIS — J3089 Other allergic rhinitis: Secondary | ICD-10-CM | POA: Diagnosis not present

## 2022-09-19 DIAGNOSIS — J3081 Allergic rhinitis due to animal (cat) (dog) hair and dander: Secondary | ICD-10-CM | POA: Diagnosis not present

## 2022-09-19 DIAGNOSIS — J301 Allergic rhinitis due to pollen: Secondary | ICD-10-CM | POA: Diagnosis not present

## 2022-09-25 DIAGNOSIS — N390 Urinary tract infection, site not specified: Secondary | ICD-10-CM | POA: Diagnosis not present

## 2022-09-27 DIAGNOSIS — B078 Other viral warts: Secondary | ICD-10-CM | POA: Diagnosis not present

## 2022-09-27 DIAGNOSIS — L720 Epidermal cyst: Secondary | ICD-10-CM | POA: Diagnosis not present

## 2022-09-27 DIAGNOSIS — D2261 Melanocytic nevi of right upper limb, including shoulder: Secondary | ICD-10-CM | POA: Diagnosis not present

## 2022-09-27 DIAGNOSIS — J301 Allergic rhinitis due to pollen: Secondary | ICD-10-CM | POA: Diagnosis not present

## 2022-09-27 DIAGNOSIS — D2262 Melanocytic nevi of left upper limb, including shoulder: Secondary | ICD-10-CM | POA: Diagnosis not present

## 2022-09-27 DIAGNOSIS — L308 Other specified dermatitis: Secondary | ICD-10-CM | POA: Diagnosis not present

## 2022-09-27 DIAGNOSIS — J3089 Other allergic rhinitis: Secondary | ICD-10-CM | POA: Diagnosis not present

## 2022-09-27 DIAGNOSIS — J3081 Allergic rhinitis due to animal (cat) (dog) hair and dander: Secondary | ICD-10-CM | POA: Diagnosis not present

## 2022-09-28 DIAGNOSIS — Z8759 Personal history of other complications of pregnancy, childbirth and the puerperium: Secondary | ICD-10-CM | POA: Diagnosis not present

## 2022-09-28 DIAGNOSIS — N96 Recurrent pregnancy loss: Secondary | ICD-10-CM | POA: Diagnosis not present

## 2022-10-04 DIAGNOSIS — J3089 Other allergic rhinitis: Secondary | ICD-10-CM | POA: Diagnosis not present

## 2022-10-04 DIAGNOSIS — J301 Allergic rhinitis due to pollen: Secondary | ICD-10-CM | POA: Diagnosis not present

## 2022-10-04 DIAGNOSIS — M6281 Muscle weakness (generalized): Secondary | ICD-10-CM | POA: Diagnosis not present

## 2022-10-04 DIAGNOSIS — M6289 Other specified disorders of muscle: Secondary | ICD-10-CM | POA: Diagnosis not present

## 2022-10-04 DIAGNOSIS — J3081 Allergic rhinitis due to animal (cat) (dog) hair and dander: Secondary | ICD-10-CM | POA: Diagnosis not present

## 2022-10-04 DIAGNOSIS — R102 Pelvic and perineal pain: Secondary | ICD-10-CM | POA: Diagnosis not present

## 2022-10-11 DIAGNOSIS — J3081 Allergic rhinitis due to animal (cat) (dog) hair and dander: Secondary | ICD-10-CM | POA: Diagnosis not present

## 2022-10-11 DIAGNOSIS — J301 Allergic rhinitis due to pollen: Secondary | ICD-10-CM | POA: Diagnosis not present

## 2022-10-11 DIAGNOSIS — J3089 Other allergic rhinitis: Secondary | ICD-10-CM | POA: Diagnosis not present

## 2022-10-11 DIAGNOSIS — F4320 Adjustment disorder, unspecified: Secondary | ICD-10-CM | POA: Diagnosis not present

## 2022-10-13 DIAGNOSIS — J301 Allergic rhinitis due to pollen: Secondary | ICD-10-CM | POA: Diagnosis not present

## 2022-10-14 DIAGNOSIS — J3081 Allergic rhinitis due to animal (cat) (dog) hair and dander: Secondary | ICD-10-CM | POA: Diagnosis not present

## 2022-10-14 DIAGNOSIS — J3089 Other allergic rhinitis: Secondary | ICD-10-CM | POA: Diagnosis not present

## 2022-10-21 DIAGNOSIS — J301 Allergic rhinitis due to pollen: Secondary | ICD-10-CM | POA: Diagnosis not present

## 2022-10-21 DIAGNOSIS — J3089 Other allergic rhinitis: Secondary | ICD-10-CM | POA: Diagnosis not present

## 2022-10-21 DIAGNOSIS — J3081 Allergic rhinitis due to animal (cat) (dog) hair and dander: Secondary | ICD-10-CM | POA: Diagnosis not present

## 2022-10-25 DIAGNOSIS — M6289 Other specified disorders of muscle: Secondary | ICD-10-CM | POA: Diagnosis not present

## 2022-10-25 DIAGNOSIS — R102 Pelvic and perineal pain: Secondary | ICD-10-CM | POA: Diagnosis not present

## 2022-10-25 DIAGNOSIS — M6281 Muscle weakness (generalized): Secondary | ICD-10-CM | POA: Diagnosis not present

## 2022-10-25 DIAGNOSIS — M62838 Other muscle spasm: Secondary | ICD-10-CM | POA: Diagnosis not present

## 2022-10-26 DIAGNOSIS — R300 Dysuria: Secondary | ICD-10-CM | POA: Diagnosis not present

## 2022-10-26 DIAGNOSIS — N3000 Acute cystitis without hematuria: Secondary | ICD-10-CM | POA: Diagnosis not present

## 2022-10-28 DIAGNOSIS — J3089 Other allergic rhinitis: Secondary | ICD-10-CM | POA: Diagnosis not present

## 2022-10-28 DIAGNOSIS — J3081 Allergic rhinitis due to animal (cat) (dog) hair and dander: Secondary | ICD-10-CM | POA: Diagnosis not present

## 2022-10-28 DIAGNOSIS — J301 Allergic rhinitis due to pollen: Secondary | ICD-10-CM | POA: Diagnosis not present

## 2022-10-28 DIAGNOSIS — F4320 Adjustment disorder, unspecified: Secondary | ICD-10-CM | POA: Diagnosis not present

## 2022-11-03 DIAGNOSIS — J3081 Allergic rhinitis due to animal (cat) (dog) hair and dander: Secondary | ICD-10-CM | POA: Diagnosis not present

## 2022-11-03 DIAGNOSIS — J301 Allergic rhinitis due to pollen: Secondary | ICD-10-CM | POA: Diagnosis not present

## 2022-11-03 DIAGNOSIS — J3089 Other allergic rhinitis: Secondary | ICD-10-CM | POA: Diagnosis not present

## 2022-11-11 DIAGNOSIS — J3089 Other allergic rhinitis: Secondary | ICD-10-CM | POA: Diagnosis not present

## 2022-11-11 DIAGNOSIS — J3081 Allergic rhinitis due to animal (cat) (dog) hair and dander: Secondary | ICD-10-CM | POA: Diagnosis not present

## 2022-11-11 DIAGNOSIS — J301 Allergic rhinitis due to pollen: Secondary | ICD-10-CM | POA: Diagnosis not present

## 2022-11-15 DIAGNOSIS — M6281 Muscle weakness (generalized): Secondary | ICD-10-CM | POA: Diagnosis not present

## 2022-11-15 DIAGNOSIS — M62838 Other muscle spasm: Secondary | ICD-10-CM | POA: Diagnosis not present

## 2022-11-15 DIAGNOSIS — M6289 Other specified disorders of muscle: Secondary | ICD-10-CM | POA: Diagnosis not present

## 2022-11-15 DIAGNOSIS — N941 Unspecified dyspareunia: Secondary | ICD-10-CM | POA: Diagnosis not present

## 2022-11-17 DIAGNOSIS — J3081 Allergic rhinitis due to animal (cat) (dog) hair and dander: Secondary | ICD-10-CM | POA: Diagnosis not present

## 2022-11-17 DIAGNOSIS — J3089 Other allergic rhinitis: Secondary | ICD-10-CM | POA: Diagnosis not present

## 2022-11-17 DIAGNOSIS — F4320 Adjustment disorder, unspecified: Secondary | ICD-10-CM | POA: Diagnosis not present

## 2022-11-17 DIAGNOSIS — J301 Allergic rhinitis due to pollen: Secondary | ICD-10-CM | POA: Diagnosis not present

## 2022-11-23 DIAGNOSIS — J3089 Other allergic rhinitis: Secondary | ICD-10-CM | POA: Diagnosis not present

## 2022-11-23 DIAGNOSIS — J301 Allergic rhinitis due to pollen: Secondary | ICD-10-CM | POA: Diagnosis not present

## 2022-11-23 DIAGNOSIS — J3081 Allergic rhinitis due to animal (cat) (dog) hair and dander: Secondary | ICD-10-CM | POA: Diagnosis not present

## 2022-11-23 DIAGNOSIS — N3010 Interstitial cystitis (chronic) without hematuria: Secondary | ICD-10-CM | POA: Diagnosis not present

## 2022-11-23 DIAGNOSIS — N3000 Acute cystitis without hematuria: Secondary | ICD-10-CM | POA: Diagnosis not present

## 2022-11-29 DIAGNOSIS — F4320 Adjustment disorder, unspecified: Secondary | ICD-10-CM | POA: Diagnosis not present

## 2022-12-02 DIAGNOSIS — J3089 Other allergic rhinitis: Secondary | ICD-10-CM | POA: Diagnosis not present

## 2022-12-02 DIAGNOSIS — J301 Allergic rhinitis due to pollen: Secondary | ICD-10-CM | POA: Diagnosis not present

## 2022-12-02 DIAGNOSIS — J3081 Allergic rhinitis due to animal (cat) (dog) hair and dander: Secondary | ICD-10-CM | POA: Diagnosis not present

## 2022-12-06 DIAGNOSIS — Z130 Encounter for screening for diseases of the blood and blood-forming organs and certain disorders involving the immune mechanism: Secondary | ICD-10-CM | POA: Diagnosis not present

## 2022-12-06 DIAGNOSIS — N760 Acute vaginitis: Secondary | ICD-10-CM | POA: Diagnosis not present

## 2022-12-06 DIAGNOSIS — N898 Other specified noninflammatory disorders of vagina: Secondary | ICD-10-CM | POA: Diagnosis not present

## 2022-12-06 DIAGNOSIS — Z6834 Body mass index (BMI) 34.0-34.9, adult: Secondary | ICD-10-CM | POA: Diagnosis not present

## 2022-12-06 DIAGNOSIS — F419 Anxiety disorder, unspecified: Secondary | ICD-10-CM | POA: Diagnosis not present

## 2022-12-09 DIAGNOSIS — J3081 Allergic rhinitis due to animal (cat) (dog) hair and dander: Secondary | ICD-10-CM | POA: Diagnosis not present

## 2022-12-09 DIAGNOSIS — J3089 Other allergic rhinitis: Secondary | ICD-10-CM | POA: Diagnosis not present

## 2022-12-09 DIAGNOSIS — J301 Allergic rhinitis due to pollen: Secondary | ICD-10-CM | POA: Diagnosis not present

## 2022-12-13 DIAGNOSIS — F4320 Adjustment disorder, unspecified: Secondary | ICD-10-CM | POA: Diagnosis not present

## 2022-12-16 DIAGNOSIS — J3089 Other allergic rhinitis: Secondary | ICD-10-CM | POA: Diagnosis not present

## 2022-12-16 DIAGNOSIS — J3081 Allergic rhinitis due to animal (cat) (dog) hair and dander: Secondary | ICD-10-CM | POA: Diagnosis not present

## 2022-12-16 DIAGNOSIS — J301 Allergic rhinitis due to pollen: Secondary | ICD-10-CM | POA: Diagnosis not present

## 2022-12-22 DIAGNOSIS — B3731 Acute candidiasis of vulva and vagina: Secondary | ICD-10-CM | POA: Diagnosis not present

## 2022-12-22 DIAGNOSIS — N898 Other specified noninflammatory disorders of vagina: Secondary | ICD-10-CM | POA: Diagnosis not present

## 2022-12-23 DIAGNOSIS — J3089 Other allergic rhinitis: Secondary | ICD-10-CM | POA: Diagnosis not present

## 2022-12-23 DIAGNOSIS — J3081 Allergic rhinitis due to animal (cat) (dog) hair and dander: Secondary | ICD-10-CM | POA: Diagnosis not present

## 2022-12-23 DIAGNOSIS — J301 Allergic rhinitis due to pollen: Secondary | ICD-10-CM | POA: Diagnosis not present

## 2022-12-30 DIAGNOSIS — J3089 Other allergic rhinitis: Secondary | ICD-10-CM | POA: Diagnosis not present

## 2022-12-30 DIAGNOSIS — J301 Allergic rhinitis due to pollen: Secondary | ICD-10-CM | POA: Diagnosis not present

## 2022-12-30 DIAGNOSIS — R102 Pelvic and perineal pain: Secondary | ICD-10-CM | POA: Diagnosis not present

## 2022-12-30 DIAGNOSIS — N941 Unspecified dyspareunia: Secondary | ICD-10-CM | POA: Diagnosis not present

## 2022-12-30 DIAGNOSIS — J3081 Allergic rhinitis due to animal (cat) (dog) hair and dander: Secondary | ICD-10-CM | POA: Diagnosis not present

## 2022-12-30 DIAGNOSIS — M62838 Other muscle spasm: Secondary | ICD-10-CM | POA: Diagnosis not present

## 2022-12-30 DIAGNOSIS — M6289 Other specified disorders of muscle: Secondary | ICD-10-CM | POA: Diagnosis not present

## 2023-01-02 DIAGNOSIS — N898 Other specified noninflammatory disorders of vagina: Secondary | ICD-10-CM | POA: Diagnosis not present

## 2023-01-03 DIAGNOSIS — N898 Other specified noninflammatory disorders of vagina: Secondary | ICD-10-CM | POA: Diagnosis not present

## 2023-01-05 DIAGNOSIS — J4530 Mild persistent asthma, uncomplicated: Secondary | ICD-10-CM | POA: Diagnosis not present

## 2023-01-05 DIAGNOSIS — E559 Vitamin D deficiency, unspecified: Secondary | ICD-10-CM | POA: Diagnosis not present

## 2023-01-05 DIAGNOSIS — Z01419 Encounter for gynecological examination (general) (routine) without abnormal findings: Secondary | ICD-10-CM | POA: Diagnosis not present

## 2023-01-05 DIAGNOSIS — N760 Acute vaginitis: Secondary | ICD-10-CM | POA: Diagnosis not present

## 2023-01-05 DIAGNOSIS — Z1151 Encounter for screening for human papillomavirus (HPV): Secondary | ICD-10-CM | POA: Diagnosis not present

## 2023-01-05 DIAGNOSIS — E010 Iodine-deficiency related diffuse (endemic) goiter: Secondary | ICD-10-CM | POA: Diagnosis not present

## 2023-01-05 DIAGNOSIS — Z8759 Personal history of other complications of pregnancy, childbirth and the puerperium: Secondary | ICD-10-CM | POA: Diagnosis not present

## 2023-01-06 DIAGNOSIS — J3089 Other allergic rhinitis: Secondary | ICD-10-CM | POA: Diagnosis not present

## 2023-01-06 DIAGNOSIS — J301 Allergic rhinitis due to pollen: Secondary | ICD-10-CM | POA: Diagnosis not present

## 2023-01-06 DIAGNOSIS — J3081 Allergic rhinitis due to animal (cat) (dog) hair and dander: Secondary | ICD-10-CM | POA: Diagnosis not present

## 2023-01-11 DIAGNOSIS — J3089 Other allergic rhinitis: Secondary | ICD-10-CM | POA: Diagnosis not present

## 2023-01-11 DIAGNOSIS — N941 Unspecified dyspareunia: Secondary | ICD-10-CM | POA: Diagnosis not present

## 2023-01-11 DIAGNOSIS — M6289 Other specified disorders of muscle: Secondary | ICD-10-CM | POA: Diagnosis not present

## 2023-01-11 DIAGNOSIS — J301 Allergic rhinitis due to pollen: Secondary | ICD-10-CM | POA: Diagnosis not present

## 2023-01-11 DIAGNOSIS — M6281 Muscle weakness (generalized): Secondary | ICD-10-CM | POA: Diagnosis not present

## 2023-01-11 DIAGNOSIS — J3081 Allergic rhinitis due to animal (cat) (dog) hair and dander: Secondary | ICD-10-CM | POA: Diagnosis not present

## 2023-01-11 DIAGNOSIS — R102 Pelvic and perineal pain: Secondary | ICD-10-CM | POA: Diagnosis not present

## 2023-01-14 DIAGNOSIS — N390 Urinary tract infection, site not specified: Secondary | ICD-10-CM | POA: Diagnosis not present

## 2023-01-14 DIAGNOSIS — R3129 Other microscopic hematuria: Secondary | ICD-10-CM | POA: Diagnosis not present

## 2023-01-20 DIAGNOSIS — J3089 Other allergic rhinitis: Secondary | ICD-10-CM | POA: Diagnosis not present

## 2023-01-20 DIAGNOSIS — J301 Allergic rhinitis due to pollen: Secondary | ICD-10-CM | POA: Diagnosis not present

## 2023-01-20 DIAGNOSIS — J3081 Allergic rhinitis due to animal (cat) (dog) hair and dander: Secondary | ICD-10-CM | POA: Diagnosis not present

## 2023-01-27 DIAGNOSIS — J3089 Other allergic rhinitis: Secondary | ICD-10-CM | POA: Diagnosis not present

## 2023-01-27 DIAGNOSIS — J3081 Allergic rhinitis due to animal (cat) (dog) hair and dander: Secondary | ICD-10-CM | POA: Diagnosis not present

## 2023-01-27 DIAGNOSIS — J301 Allergic rhinitis due to pollen: Secondary | ICD-10-CM | POA: Diagnosis not present

## 2023-01-31 DIAGNOSIS — F4320 Adjustment disorder, unspecified: Secondary | ICD-10-CM | POA: Diagnosis not present

## 2023-01-31 DIAGNOSIS — R102 Pelvic and perineal pain: Secondary | ICD-10-CM | POA: Diagnosis not present

## 2023-01-31 DIAGNOSIS — M6281 Muscle weakness (generalized): Secondary | ICD-10-CM | POA: Diagnosis not present

## 2023-01-31 DIAGNOSIS — M62838 Other muscle spasm: Secondary | ICD-10-CM | POA: Diagnosis not present

## 2023-01-31 DIAGNOSIS — M6289 Other specified disorders of muscle: Secondary | ICD-10-CM | POA: Diagnosis not present

## 2023-02-03 DIAGNOSIS — J301 Allergic rhinitis due to pollen: Secondary | ICD-10-CM | POA: Diagnosis not present

## 2023-02-03 DIAGNOSIS — J3081 Allergic rhinitis due to animal (cat) (dog) hair and dander: Secondary | ICD-10-CM | POA: Diagnosis not present

## 2023-02-03 DIAGNOSIS — J3089 Other allergic rhinitis: Secondary | ICD-10-CM | POA: Diagnosis not present

## 2023-02-10 DIAGNOSIS — J3081 Allergic rhinitis due to animal (cat) (dog) hair and dander: Secondary | ICD-10-CM | POA: Diagnosis not present

## 2023-02-10 DIAGNOSIS — J301 Allergic rhinitis due to pollen: Secondary | ICD-10-CM | POA: Diagnosis not present

## 2023-02-10 DIAGNOSIS — J3089 Other allergic rhinitis: Secondary | ICD-10-CM | POA: Diagnosis not present

## 2023-02-17 DIAGNOSIS — J301 Allergic rhinitis due to pollen: Secondary | ICD-10-CM | POA: Diagnosis not present

## 2023-02-17 DIAGNOSIS — J3081 Allergic rhinitis due to animal (cat) (dog) hair and dander: Secondary | ICD-10-CM | POA: Diagnosis not present

## 2023-02-17 DIAGNOSIS — J3089 Other allergic rhinitis: Secondary | ICD-10-CM | POA: Diagnosis not present

## 2023-02-22 DIAGNOSIS — J3089 Other allergic rhinitis: Secondary | ICD-10-CM | POA: Diagnosis not present

## 2023-02-22 DIAGNOSIS — J301 Allergic rhinitis due to pollen: Secondary | ICD-10-CM | POA: Diagnosis not present

## 2023-02-22 DIAGNOSIS — J3081 Allergic rhinitis due to animal (cat) (dog) hair and dander: Secondary | ICD-10-CM | POA: Diagnosis not present

## 2023-02-28 DIAGNOSIS — F4320 Adjustment disorder, unspecified: Secondary | ICD-10-CM | POA: Diagnosis not present

## 2023-03-02 DIAGNOSIS — M6281 Muscle weakness (generalized): Secondary | ICD-10-CM | POA: Diagnosis not present

## 2023-03-02 DIAGNOSIS — J3081 Allergic rhinitis due to animal (cat) (dog) hair and dander: Secondary | ICD-10-CM | POA: Diagnosis not present

## 2023-03-02 DIAGNOSIS — J301 Allergic rhinitis due to pollen: Secondary | ICD-10-CM | POA: Diagnosis not present

## 2023-03-02 DIAGNOSIS — M6289 Other specified disorders of muscle: Secondary | ICD-10-CM | POA: Diagnosis not present

## 2023-03-02 DIAGNOSIS — M62838 Other muscle spasm: Secondary | ICD-10-CM | POA: Diagnosis not present

## 2023-03-02 DIAGNOSIS — R102 Pelvic and perineal pain: Secondary | ICD-10-CM | POA: Diagnosis not present

## 2023-03-02 DIAGNOSIS — J3089 Other allergic rhinitis: Secondary | ICD-10-CM | POA: Diagnosis not present

## 2023-03-09 DIAGNOSIS — J301 Allergic rhinitis due to pollen: Secondary | ICD-10-CM | POA: Diagnosis not present

## 2023-03-09 DIAGNOSIS — J3089 Other allergic rhinitis: Secondary | ICD-10-CM | POA: Diagnosis not present

## 2023-03-09 DIAGNOSIS — J3081 Allergic rhinitis due to animal (cat) (dog) hair and dander: Secondary | ICD-10-CM | POA: Diagnosis not present

## 2023-03-10 DIAGNOSIS — J3081 Allergic rhinitis due to animal (cat) (dog) hair and dander: Secondary | ICD-10-CM | POA: Diagnosis not present

## 2023-03-10 DIAGNOSIS — J3089 Other allergic rhinitis: Secondary | ICD-10-CM | POA: Diagnosis not present

## 2023-03-21 DIAGNOSIS — J3089 Other allergic rhinitis: Secondary | ICD-10-CM | POA: Diagnosis not present

## 2023-03-21 DIAGNOSIS — J301 Allergic rhinitis due to pollen: Secondary | ICD-10-CM | POA: Diagnosis not present

## 2023-03-21 DIAGNOSIS — J3081 Allergic rhinitis due to animal (cat) (dog) hair and dander: Secondary | ICD-10-CM | POA: Diagnosis not present

## 2023-03-24 DIAGNOSIS — M62838 Other muscle spasm: Secondary | ICD-10-CM | POA: Diagnosis not present

## 2023-03-24 DIAGNOSIS — N941 Unspecified dyspareunia: Secondary | ICD-10-CM | POA: Diagnosis not present

## 2023-03-24 DIAGNOSIS — M6289 Other specified disorders of muscle: Secondary | ICD-10-CM | POA: Diagnosis not present

## 2023-03-24 DIAGNOSIS — M6281 Muscle weakness (generalized): Secondary | ICD-10-CM | POA: Diagnosis not present

## 2023-03-27 DIAGNOSIS — R102 Pelvic and perineal pain: Secondary | ICD-10-CM | POA: Diagnosis not present

## 2023-03-27 DIAGNOSIS — J3081 Allergic rhinitis due to animal (cat) (dog) hair and dander: Secondary | ICD-10-CM | POA: Diagnosis not present

## 2023-03-27 DIAGNOSIS — M6289 Other specified disorders of muscle: Secondary | ICD-10-CM | POA: Diagnosis not present

## 2023-03-27 DIAGNOSIS — J3089 Other allergic rhinitis: Secondary | ICD-10-CM | POA: Diagnosis not present

## 2023-03-27 DIAGNOSIS — M6281 Muscle weakness (generalized): Secondary | ICD-10-CM | POA: Diagnosis not present

## 2023-03-27 DIAGNOSIS — M62838 Other muscle spasm: Secondary | ICD-10-CM | POA: Diagnosis not present

## 2023-03-27 DIAGNOSIS — J301 Allergic rhinitis due to pollen: Secondary | ICD-10-CM | POA: Diagnosis not present

## 2023-03-28 DIAGNOSIS — F4320 Adjustment disorder, unspecified: Secondary | ICD-10-CM | POA: Diagnosis not present

## 2023-04-06 DIAGNOSIS — J301 Allergic rhinitis due to pollen: Secondary | ICD-10-CM | POA: Diagnosis not present

## 2023-04-06 DIAGNOSIS — J3089 Other allergic rhinitis: Secondary | ICD-10-CM | POA: Diagnosis not present

## 2023-04-06 DIAGNOSIS — J3081 Allergic rhinitis due to animal (cat) (dog) hair and dander: Secondary | ICD-10-CM | POA: Diagnosis not present

## 2023-04-14 DIAGNOSIS — J3081 Allergic rhinitis due to animal (cat) (dog) hair and dander: Secondary | ICD-10-CM | POA: Diagnosis not present

## 2023-04-14 DIAGNOSIS — J301 Allergic rhinitis due to pollen: Secondary | ICD-10-CM | POA: Diagnosis not present

## 2023-04-14 DIAGNOSIS — J3089 Other allergic rhinitis: Secondary | ICD-10-CM | POA: Diagnosis not present

## 2023-04-18 DIAGNOSIS — M6289 Other specified disorders of muscle: Secondary | ICD-10-CM | POA: Diagnosis not present

## 2023-04-18 DIAGNOSIS — R102 Pelvic and perineal pain: Secondary | ICD-10-CM | POA: Diagnosis not present

## 2023-04-18 DIAGNOSIS — M6281 Muscle weakness (generalized): Secondary | ICD-10-CM | POA: Diagnosis not present

## 2023-04-18 DIAGNOSIS — M62838 Other muscle spasm: Secondary | ICD-10-CM | POA: Diagnosis not present

## 2023-04-19 DIAGNOSIS — J3081 Allergic rhinitis due to animal (cat) (dog) hair and dander: Secondary | ICD-10-CM | POA: Diagnosis not present

## 2023-04-19 DIAGNOSIS — J301 Allergic rhinitis due to pollen: Secondary | ICD-10-CM | POA: Diagnosis not present

## 2023-04-19 DIAGNOSIS — J3089 Other allergic rhinitis: Secondary | ICD-10-CM | POA: Diagnosis not present

## 2023-04-25 DIAGNOSIS — J301 Allergic rhinitis due to pollen: Secondary | ICD-10-CM | POA: Diagnosis not present

## 2023-04-25 DIAGNOSIS — J3089 Other allergic rhinitis: Secondary | ICD-10-CM | POA: Diagnosis not present

## 2023-04-25 DIAGNOSIS — F4320 Adjustment disorder, unspecified: Secondary | ICD-10-CM | POA: Diagnosis not present

## 2023-04-25 DIAGNOSIS — J3081 Allergic rhinitis due to animal (cat) (dog) hair and dander: Secondary | ICD-10-CM | POA: Diagnosis not present

## 2023-05-01 DIAGNOSIS — J3081 Allergic rhinitis due to animal (cat) (dog) hair and dander: Secondary | ICD-10-CM | POA: Diagnosis not present

## 2023-05-01 DIAGNOSIS — J3089 Other allergic rhinitis: Secondary | ICD-10-CM | POA: Diagnosis not present

## 2023-05-01 DIAGNOSIS — J301 Allergic rhinitis due to pollen: Secondary | ICD-10-CM | POA: Diagnosis not present

## 2023-05-04 DIAGNOSIS — M6289 Other specified disorders of muscle: Secondary | ICD-10-CM | POA: Diagnosis not present

## 2023-05-04 DIAGNOSIS — M6281 Muscle weakness (generalized): Secondary | ICD-10-CM | POA: Diagnosis not present

## 2023-05-04 DIAGNOSIS — M62838 Other muscle spasm: Secondary | ICD-10-CM | POA: Diagnosis not present

## 2023-05-04 DIAGNOSIS — R102 Pelvic and perineal pain: Secondary | ICD-10-CM | POA: Diagnosis not present

## 2023-05-08 DIAGNOSIS — J3081 Allergic rhinitis due to animal (cat) (dog) hair and dander: Secondary | ICD-10-CM | POA: Diagnosis not present

## 2023-05-08 DIAGNOSIS — J3089 Other allergic rhinitis: Secondary | ICD-10-CM | POA: Diagnosis not present

## 2023-05-08 DIAGNOSIS — J301 Allergic rhinitis due to pollen: Secondary | ICD-10-CM | POA: Diagnosis not present

## 2023-05-18 DIAGNOSIS — J301 Allergic rhinitis due to pollen: Secondary | ICD-10-CM | POA: Diagnosis not present

## 2023-05-18 DIAGNOSIS — J3089 Other allergic rhinitis: Secondary | ICD-10-CM | POA: Diagnosis not present

## 2023-05-18 DIAGNOSIS — R102 Pelvic and perineal pain: Secondary | ICD-10-CM | POA: Diagnosis not present

## 2023-05-18 DIAGNOSIS — M62838 Other muscle spasm: Secondary | ICD-10-CM | POA: Diagnosis not present

## 2023-05-18 DIAGNOSIS — M6281 Muscle weakness (generalized): Secondary | ICD-10-CM | POA: Diagnosis not present

## 2023-05-18 DIAGNOSIS — N941 Unspecified dyspareunia: Secondary | ICD-10-CM | POA: Diagnosis not present

## 2023-05-18 DIAGNOSIS — J3081 Allergic rhinitis due to animal (cat) (dog) hair and dander: Secondary | ICD-10-CM | POA: Diagnosis not present

## 2023-05-23 DIAGNOSIS — M62838 Other muscle spasm: Secondary | ICD-10-CM | POA: Diagnosis not present

## 2023-05-23 DIAGNOSIS — N941 Unspecified dyspareunia: Secondary | ICD-10-CM | POA: Diagnosis not present

## 2023-05-23 DIAGNOSIS — M6281 Muscle weakness (generalized): Secondary | ICD-10-CM | POA: Diagnosis not present

## 2023-05-23 DIAGNOSIS — R102 Pelvic and perineal pain: Secondary | ICD-10-CM | POA: Diagnosis not present

## 2023-05-25 DIAGNOSIS — J3089 Other allergic rhinitis: Secondary | ICD-10-CM | POA: Diagnosis not present

## 2023-05-25 DIAGNOSIS — J3081 Allergic rhinitis due to animal (cat) (dog) hair and dander: Secondary | ICD-10-CM | POA: Diagnosis not present

## 2023-05-25 DIAGNOSIS — J301 Allergic rhinitis due to pollen: Secondary | ICD-10-CM | POA: Diagnosis not present

## 2023-05-30 DIAGNOSIS — F4320 Adjustment disorder, unspecified: Secondary | ICD-10-CM | POA: Diagnosis not present

## 2023-06-01 DIAGNOSIS — J3089 Other allergic rhinitis: Secondary | ICD-10-CM | POA: Diagnosis not present

## 2023-06-01 DIAGNOSIS — J3081 Allergic rhinitis due to animal (cat) (dog) hair and dander: Secondary | ICD-10-CM | POA: Diagnosis not present

## 2023-06-01 DIAGNOSIS — J301 Allergic rhinitis due to pollen: Secondary | ICD-10-CM | POA: Diagnosis not present

## 2023-06-08 DIAGNOSIS — J301 Allergic rhinitis due to pollen: Secondary | ICD-10-CM | POA: Diagnosis not present

## 2023-06-08 DIAGNOSIS — J3089 Other allergic rhinitis: Secondary | ICD-10-CM | POA: Diagnosis not present

## 2023-06-08 DIAGNOSIS — J3081 Allergic rhinitis due to animal (cat) (dog) hair and dander: Secondary | ICD-10-CM | POA: Diagnosis not present

## 2023-06-14 DIAGNOSIS — J301 Allergic rhinitis due to pollen: Secondary | ICD-10-CM | POA: Diagnosis not present

## 2023-06-14 DIAGNOSIS — J3081 Allergic rhinitis due to animal (cat) (dog) hair and dander: Secondary | ICD-10-CM | POA: Diagnosis not present

## 2023-06-14 DIAGNOSIS — J4530 Mild persistent asthma, uncomplicated: Secondary | ICD-10-CM | POA: Diagnosis not present

## 2023-06-14 DIAGNOSIS — J3089 Other allergic rhinitis: Secondary | ICD-10-CM | POA: Diagnosis not present

## 2023-06-20 DIAGNOSIS — F4320 Adjustment disorder, unspecified: Secondary | ICD-10-CM | POA: Diagnosis not present

## 2023-06-22 DIAGNOSIS — J3081 Allergic rhinitis due to animal (cat) (dog) hair and dander: Secondary | ICD-10-CM | POA: Diagnosis not present

## 2023-06-22 DIAGNOSIS — J3089 Other allergic rhinitis: Secondary | ICD-10-CM | POA: Diagnosis not present

## 2023-06-22 DIAGNOSIS — J301 Allergic rhinitis due to pollen: Secondary | ICD-10-CM | POA: Diagnosis not present

## 2023-06-29 DIAGNOSIS — J301 Allergic rhinitis due to pollen: Secondary | ICD-10-CM | POA: Diagnosis not present

## 2023-06-29 DIAGNOSIS — J3081 Allergic rhinitis due to animal (cat) (dog) hair and dander: Secondary | ICD-10-CM | POA: Diagnosis not present

## 2023-06-29 DIAGNOSIS — J3089 Other allergic rhinitis: Secondary | ICD-10-CM | POA: Diagnosis not present

## 2023-07-04 DIAGNOSIS — M6289 Other specified disorders of muscle: Secondary | ICD-10-CM | POA: Diagnosis not present

## 2023-07-04 DIAGNOSIS — M62838 Other muscle spasm: Secondary | ICD-10-CM | POA: Diagnosis not present

## 2023-07-04 DIAGNOSIS — M533 Sacrococcygeal disorders, not elsewhere classified: Secondary | ICD-10-CM | POA: Diagnosis not present

## 2023-07-04 DIAGNOSIS — J3081 Allergic rhinitis due to animal (cat) (dog) hair and dander: Secondary | ICD-10-CM | POA: Diagnosis not present

## 2023-07-04 DIAGNOSIS — J3089 Other allergic rhinitis: Secondary | ICD-10-CM | POA: Diagnosis not present

## 2023-07-04 DIAGNOSIS — J301 Allergic rhinitis due to pollen: Secondary | ICD-10-CM | POA: Diagnosis not present

## 2023-07-04 DIAGNOSIS — M6281 Muscle weakness (generalized): Secondary | ICD-10-CM | POA: Diagnosis not present

## 2023-07-10 DIAGNOSIS — J3081 Allergic rhinitis due to animal (cat) (dog) hair and dander: Secondary | ICD-10-CM | POA: Diagnosis not present

## 2023-07-10 DIAGNOSIS — J301 Allergic rhinitis due to pollen: Secondary | ICD-10-CM | POA: Diagnosis not present

## 2023-07-10 DIAGNOSIS — J3089 Other allergic rhinitis: Secondary | ICD-10-CM | POA: Diagnosis not present

## 2023-07-17 DIAGNOSIS — J3081 Allergic rhinitis due to animal (cat) (dog) hair and dander: Secondary | ICD-10-CM | POA: Diagnosis not present

## 2023-07-17 DIAGNOSIS — J3089 Other allergic rhinitis: Secondary | ICD-10-CM | POA: Diagnosis not present

## 2023-07-17 DIAGNOSIS — J301 Allergic rhinitis due to pollen: Secondary | ICD-10-CM | POA: Diagnosis not present

## 2023-07-18 DIAGNOSIS — F4320 Adjustment disorder, unspecified: Secondary | ICD-10-CM | POA: Diagnosis not present

## 2023-07-28 DIAGNOSIS — J3081 Allergic rhinitis due to animal (cat) (dog) hair and dander: Secondary | ICD-10-CM | POA: Diagnosis not present

## 2023-07-28 DIAGNOSIS — J3089 Other allergic rhinitis: Secondary | ICD-10-CM | POA: Diagnosis not present

## 2023-07-28 DIAGNOSIS — J301 Allergic rhinitis due to pollen: Secondary | ICD-10-CM | POA: Diagnosis not present

## 2023-08-02 DIAGNOSIS — M6289 Other specified disorders of muscle: Secondary | ICD-10-CM | POA: Diagnosis not present

## 2023-08-02 DIAGNOSIS — M6281 Muscle weakness (generalized): Secondary | ICD-10-CM | POA: Diagnosis not present

## 2023-08-02 DIAGNOSIS — M533 Sacrococcygeal disorders, not elsewhere classified: Secondary | ICD-10-CM | POA: Diagnosis not present

## 2023-08-02 DIAGNOSIS — M62838 Other muscle spasm: Secondary | ICD-10-CM | POA: Diagnosis not present

## 2023-08-08 DIAGNOSIS — F4320 Adjustment disorder, unspecified: Secondary | ICD-10-CM | POA: Diagnosis not present

## 2023-08-11 DIAGNOSIS — J301 Allergic rhinitis due to pollen: Secondary | ICD-10-CM | POA: Diagnosis not present

## 2023-08-11 DIAGNOSIS — J3089 Other allergic rhinitis: Secondary | ICD-10-CM | POA: Diagnosis not present

## 2023-08-11 DIAGNOSIS — J3081 Allergic rhinitis due to animal (cat) (dog) hair and dander: Secondary | ICD-10-CM | POA: Diagnosis not present

## 2023-08-18 DIAGNOSIS — J3081 Allergic rhinitis due to animal (cat) (dog) hair and dander: Secondary | ICD-10-CM | POA: Diagnosis not present

## 2023-08-18 DIAGNOSIS — J301 Allergic rhinitis due to pollen: Secondary | ICD-10-CM | POA: Diagnosis not present

## 2023-08-18 DIAGNOSIS — J3089 Other allergic rhinitis: Secondary | ICD-10-CM | POA: Diagnosis not present

## 2023-08-24 DIAGNOSIS — J3089 Other allergic rhinitis: Secondary | ICD-10-CM | POA: Diagnosis not present

## 2023-08-24 DIAGNOSIS — J3081 Allergic rhinitis due to animal (cat) (dog) hair and dander: Secondary | ICD-10-CM | POA: Diagnosis not present

## 2023-08-24 DIAGNOSIS — J301 Allergic rhinitis due to pollen: Secondary | ICD-10-CM | POA: Diagnosis not present

## 2023-08-29 DIAGNOSIS — F4320 Adjustment disorder, unspecified: Secondary | ICD-10-CM | POA: Diagnosis not present

## 2023-08-31 DIAGNOSIS — J3081 Allergic rhinitis due to animal (cat) (dog) hair and dander: Secondary | ICD-10-CM | POA: Diagnosis not present

## 2023-08-31 DIAGNOSIS — J301 Allergic rhinitis due to pollen: Secondary | ICD-10-CM | POA: Diagnosis not present

## 2023-08-31 DIAGNOSIS — J3089 Other allergic rhinitis: Secondary | ICD-10-CM | POA: Diagnosis not present

## 2023-09-08 DIAGNOSIS — J301 Allergic rhinitis due to pollen: Secondary | ICD-10-CM | POA: Diagnosis not present

## 2023-09-08 DIAGNOSIS — J3089 Other allergic rhinitis: Secondary | ICD-10-CM | POA: Diagnosis not present

## 2023-09-08 DIAGNOSIS — J3081 Allergic rhinitis due to animal (cat) (dog) hair and dander: Secondary | ICD-10-CM | POA: Diagnosis not present

## 2023-09-14 DIAGNOSIS — J3089 Other allergic rhinitis: Secondary | ICD-10-CM | POA: Diagnosis not present

## 2023-09-14 DIAGNOSIS — J3081 Allergic rhinitis due to animal (cat) (dog) hair and dander: Secondary | ICD-10-CM | POA: Diagnosis not present

## 2023-09-14 DIAGNOSIS — J301 Allergic rhinitis due to pollen: Secondary | ICD-10-CM | POA: Diagnosis not present

## 2023-09-20 DIAGNOSIS — J3089 Other allergic rhinitis: Secondary | ICD-10-CM | POA: Diagnosis not present

## 2023-09-20 DIAGNOSIS — J3081 Allergic rhinitis due to animal (cat) (dog) hair and dander: Secondary | ICD-10-CM | POA: Diagnosis not present

## 2023-09-20 DIAGNOSIS — J301 Allergic rhinitis due to pollen: Secondary | ICD-10-CM | POA: Diagnosis not present

## 2023-09-22 DIAGNOSIS — J301 Allergic rhinitis due to pollen: Secondary | ICD-10-CM | POA: Diagnosis not present

## 2023-09-22 DIAGNOSIS — J3089 Other allergic rhinitis: Secondary | ICD-10-CM | POA: Diagnosis not present

## 2023-09-22 DIAGNOSIS — J3081 Allergic rhinitis due to animal (cat) (dog) hair and dander: Secondary | ICD-10-CM | POA: Diagnosis not present

## 2023-10-02 DIAGNOSIS — L814 Other melanin hyperpigmentation: Secondary | ICD-10-CM | POA: Diagnosis not present

## 2023-10-02 DIAGNOSIS — J301 Allergic rhinitis due to pollen: Secondary | ICD-10-CM | POA: Diagnosis not present

## 2023-10-02 DIAGNOSIS — D2272 Melanocytic nevi of left lower limb, including hip: Secondary | ICD-10-CM | POA: Diagnosis not present

## 2023-10-02 DIAGNOSIS — J3089 Other allergic rhinitis: Secondary | ICD-10-CM | POA: Diagnosis not present

## 2023-10-02 DIAGNOSIS — D2261 Melanocytic nevi of right upper limb, including shoulder: Secondary | ICD-10-CM | POA: Diagnosis not present

## 2023-10-02 DIAGNOSIS — J3081 Allergic rhinitis due to animal (cat) (dog) hair and dander: Secondary | ICD-10-CM | POA: Diagnosis not present

## 2023-10-02 DIAGNOSIS — D2262 Melanocytic nevi of left upper limb, including shoulder: Secondary | ICD-10-CM | POA: Diagnosis not present

## 2023-10-02 DIAGNOSIS — D485 Neoplasm of uncertain behavior of skin: Secondary | ICD-10-CM | POA: Diagnosis not present

## 2023-10-10 DIAGNOSIS — D485 Neoplasm of uncertain behavior of skin: Secondary | ICD-10-CM | POA: Diagnosis not present

## 2023-10-10 DIAGNOSIS — L988 Other specified disorders of the skin and subcutaneous tissue: Secondary | ICD-10-CM | POA: Diagnosis not present

## 2023-10-17 DIAGNOSIS — F4320 Adjustment disorder, unspecified: Secondary | ICD-10-CM | POA: Diagnosis not present

## 2023-10-19 DIAGNOSIS — J3081 Allergic rhinitis due to animal (cat) (dog) hair and dander: Secondary | ICD-10-CM | POA: Diagnosis not present

## 2023-10-19 DIAGNOSIS — J3089 Other allergic rhinitis: Secondary | ICD-10-CM | POA: Diagnosis not present

## 2023-10-19 DIAGNOSIS — J301 Allergic rhinitis due to pollen: Secondary | ICD-10-CM | POA: Diagnosis not present

## 2023-10-24 DIAGNOSIS — J3089 Other allergic rhinitis: Secondary | ICD-10-CM | POA: Diagnosis not present

## 2023-10-24 DIAGNOSIS — J3081 Allergic rhinitis due to animal (cat) (dog) hair and dander: Secondary | ICD-10-CM | POA: Diagnosis not present

## 2023-10-24 DIAGNOSIS — J301 Allergic rhinitis due to pollen: Secondary | ICD-10-CM | POA: Diagnosis not present

## 2023-11-03 DIAGNOSIS — J3081 Allergic rhinitis due to animal (cat) (dog) hair and dander: Secondary | ICD-10-CM | POA: Diagnosis not present

## 2023-11-03 DIAGNOSIS — J301 Allergic rhinitis due to pollen: Secondary | ICD-10-CM | POA: Diagnosis not present

## 2023-11-03 DIAGNOSIS — J3089 Other allergic rhinitis: Secondary | ICD-10-CM | POA: Diagnosis not present

## 2023-11-13 DIAGNOSIS — Z8759 Personal history of other complications of pregnancy, childbirth and the puerperium: Secondary | ICD-10-CM | POA: Diagnosis not present

## 2023-11-16 DIAGNOSIS — N926 Irregular menstruation, unspecified: Secondary | ICD-10-CM | POA: Diagnosis not present

## 2023-11-16 DIAGNOSIS — Z6832 Body mass index (BMI) 32.0-32.9, adult: Secondary | ICD-10-CM | POA: Diagnosis not present

## 2023-11-16 DIAGNOSIS — E010 Iodine-deficiency related diffuse (endemic) goiter: Secondary | ICD-10-CM | POA: Diagnosis not present

## 2023-11-16 DIAGNOSIS — Z719 Counseling, unspecified: Secondary | ICD-10-CM | POA: Diagnosis not present

## 2023-11-17 DIAGNOSIS — J3081 Allergic rhinitis due to animal (cat) (dog) hair and dander: Secondary | ICD-10-CM | POA: Diagnosis not present

## 2023-11-17 DIAGNOSIS — J3089 Other allergic rhinitis: Secondary | ICD-10-CM | POA: Diagnosis not present

## 2023-11-17 DIAGNOSIS — J301 Allergic rhinitis due to pollen: Secondary | ICD-10-CM | POA: Diagnosis not present

## 2023-11-27 DIAGNOSIS — J301 Allergic rhinitis due to pollen: Secondary | ICD-10-CM | POA: Diagnosis not present

## 2023-11-27 DIAGNOSIS — J3081 Allergic rhinitis due to animal (cat) (dog) hair and dander: Secondary | ICD-10-CM | POA: Diagnosis not present

## 2023-11-27 DIAGNOSIS — J3089 Other allergic rhinitis: Secondary | ICD-10-CM | POA: Diagnosis not present

## 2023-12-07 DIAGNOSIS — J301 Allergic rhinitis due to pollen: Secondary | ICD-10-CM | POA: Diagnosis not present

## 2023-12-07 DIAGNOSIS — J3081 Allergic rhinitis due to animal (cat) (dog) hair and dander: Secondary | ICD-10-CM | POA: Diagnosis not present

## 2023-12-07 DIAGNOSIS — J3089 Other allergic rhinitis: Secondary | ICD-10-CM | POA: Diagnosis not present

## 2023-12-08 DIAGNOSIS — R7989 Other specified abnormal findings of blood chemistry: Secondary | ICD-10-CM | POA: Diagnosis not present

## 2023-12-08 DIAGNOSIS — O0990 Supervision of high risk pregnancy, unspecified, unspecified trimester: Secondary | ICD-10-CM | POA: Diagnosis not present

## 2023-12-08 DIAGNOSIS — N96 Recurrent pregnancy loss: Secondary | ICD-10-CM | POA: Diagnosis not present

## 2023-12-08 DIAGNOSIS — O09529 Supervision of elderly multigravida, unspecified trimester: Secondary | ICD-10-CM | POA: Diagnosis not present

## 2023-12-08 DIAGNOSIS — O3680X0 Pregnancy with inconclusive fetal viability, not applicable or unspecified: Secondary | ICD-10-CM | POA: Diagnosis not present

## 2023-12-08 DIAGNOSIS — Z1330 Encounter for screening examination for mental health and behavioral disorders, unspecified: Secondary | ICD-10-CM | POA: Diagnosis not present

## 2023-12-08 DIAGNOSIS — Z113 Encounter for screening for infections with a predominantly sexual mode of transmission: Secondary | ICD-10-CM | POA: Diagnosis not present

## 2023-12-08 DIAGNOSIS — Z8759 Personal history of other complications of pregnancy, childbirth and the puerperium: Secondary | ICD-10-CM | POA: Diagnosis not present

## 2023-12-08 DIAGNOSIS — E559 Vitamin D deficiency, unspecified: Secondary | ICD-10-CM | POA: Diagnosis not present

## 2023-12-11 DIAGNOSIS — R7989 Other specified abnormal findings of blood chemistry: Secondary | ICD-10-CM | POA: Diagnosis not present

## 2023-12-14 DIAGNOSIS — J301 Allergic rhinitis due to pollen: Secondary | ICD-10-CM | POA: Diagnosis not present

## 2023-12-14 DIAGNOSIS — J3081 Allergic rhinitis due to animal (cat) (dog) hair and dander: Secondary | ICD-10-CM | POA: Diagnosis not present

## 2023-12-14 DIAGNOSIS — J3089 Other allergic rhinitis: Secondary | ICD-10-CM | POA: Diagnosis not present

## 2023-12-22 DIAGNOSIS — J3081 Allergic rhinitis due to animal (cat) (dog) hair and dander: Secondary | ICD-10-CM | POA: Diagnosis not present

## 2023-12-22 DIAGNOSIS — J3089 Other allergic rhinitis: Secondary | ICD-10-CM | POA: Diagnosis not present

## 2023-12-22 DIAGNOSIS — J301 Allergic rhinitis due to pollen: Secondary | ICD-10-CM | POA: Diagnosis not present

## 2023-12-29 DIAGNOSIS — J3089 Other allergic rhinitis: Secondary | ICD-10-CM | POA: Diagnosis not present

## 2023-12-29 DIAGNOSIS — J3081 Allergic rhinitis due to animal (cat) (dog) hair and dander: Secondary | ICD-10-CM | POA: Diagnosis not present

## 2023-12-29 DIAGNOSIS — J301 Allergic rhinitis due to pollen: Secondary | ICD-10-CM | POA: Diagnosis not present

## 2024-01-05 DIAGNOSIS — J3081 Allergic rhinitis due to animal (cat) (dog) hair and dander: Secondary | ICD-10-CM | POA: Diagnosis not present

## 2024-01-05 DIAGNOSIS — J3089 Other allergic rhinitis: Secondary | ICD-10-CM | POA: Diagnosis not present

## 2024-01-05 DIAGNOSIS — J301 Allergic rhinitis due to pollen: Secondary | ICD-10-CM | POA: Diagnosis not present

## 2024-01-10 DIAGNOSIS — R102 Pelvic and perineal pain: Secondary | ICD-10-CM | POA: Diagnosis not present

## 2024-01-10 DIAGNOSIS — Z8759 Personal history of other complications of pregnancy, childbirth and the puerperium: Secondary | ICD-10-CM | POA: Diagnosis not present

## 2024-01-10 DIAGNOSIS — R7989 Other specified abnormal findings of blood chemistry: Secondary | ICD-10-CM | POA: Diagnosis not present

## 2024-01-10 DIAGNOSIS — R339 Retention of urine, unspecified: Secondary | ICD-10-CM | POA: Diagnosis not present

## 2024-01-10 DIAGNOSIS — O26891 Other specified pregnancy related conditions, first trimester: Secondary | ICD-10-CM | POA: Diagnosis not present

## 2024-01-10 DIAGNOSIS — O34511 Maternal care for incarceration of gravid uterus, first trimester: Secondary | ICD-10-CM | POA: Diagnosis not present

## 2024-01-10 DIAGNOSIS — N854 Malposition of uterus: Secondary | ICD-10-CM | POA: Diagnosis not present

## 2024-01-11 DIAGNOSIS — R339 Retention of urine, unspecified: Secondary | ICD-10-CM | POA: Diagnosis not present

## 2024-01-11 DIAGNOSIS — Z8759 Personal history of other complications of pregnancy, childbirth and the puerperium: Secondary | ICD-10-CM | POA: Diagnosis not present

## 2024-01-11 DIAGNOSIS — O34511 Maternal care for incarceration of gravid uterus, first trimester: Secondary | ICD-10-CM | POA: Diagnosis not present

## 2024-01-17 DIAGNOSIS — O34519 Maternal care for incarceration of gravid uterus, unspecified trimester: Secondary | ICD-10-CM | POA: Diagnosis not present

## 2024-01-17 DIAGNOSIS — O34511 Maternal care for incarceration of gravid uterus, first trimester: Secondary | ICD-10-CM | POA: Diagnosis not present

## 2024-01-17 DIAGNOSIS — O0990 Supervision of high risk pregnancy, unspecified, unspecified trimester: Secondary | ICD-10-CM | POA: Diagnosis not present

## 2024-01-17 DIAGNOSIS — J45909 Unspecified asthma, uncomplicated: Secondary | ICD-10-CM | POA: Diagnosis not present

## 2024-01-17 DIAGNOSIS — E010 Iodine-deficiency related diffuse (endemic) goiter: Secondary | ICD-10-CM | POA: Diagnosis not present

## 2024-01-17 DIAGNOSIS — N858 Other specified noninflammatory disorders of uterus: Secondary | ICD-10-CM | POA: Diagnosis not present

## 2024-01-17 DIAGNOSIS — Z9049 Acquired absence of other specified parts of digestive tract: Secondary | ICD-10-CM | POA: Diagnosis not present

## 2024-01-17 DIAGNOSIS — O99211 Obesity complicating pregnancy, first trimester: Secondary | ICD-10-CM | POA: Diagnosis not present

## 2024-01-17 DIAGNOSIS — Z8759 Personal history of other complications of pregnancy, childbirth and the puerperium: Secondary | ICD-10-CM | POA: Diagnosis not present

## 2024-01-17 DIAGNOSIS — E669 Obesity, unspecified: Secondary | ICD-10-CM | POA: Diagnosis not present

## 2024-01-17 DIAGNOSIS — Z3A13 13 weeks gestation of pregnancy: Secondary | ICD-10-CM | POA: Diagnosis not present

## 2024-01-26 DIAGNOSIS — Z3A14 14 weeks gestation of pregnancy: Secondary | ICD-10-CM | POA: Diagnosis not present

## 2024-01-26 DIAGNOSIS — O0990 Supervision of high risk pregnancy, unspecified, unspecified trimester: Secondary | ICD-10-CM | POA: Diagnosis not present

## 2024-01-26 DIAGNOSIS — N854 Malposition of uterus: Secondary | ICD-10-CM | POA: Diagnosis not present

## 2024-01-30 DIAGNOSIS — O0992 Supervision of high risk pregnancy, unspecified, second trimester: Secondary | ICD-10-CM | POA: Diagnosis not present

## 2024-01-30 DIAGNOSIS — O34593 Maternal care for other abnormalities of gravid uterus, third trimester: Secondary | ICD-10-CM | POA: Diagnosis not present

## 2024-01-30 DIAGNOSIS — Z1589 Genetic susceptibility to other disease: Secondary | ICD-10-CM | POA: Diagnosis not present

## 2024-01-30 DIAGNOSIS — Z3A15 15 weeks gestation of pregnancy: Secondary | ICD-10-CM | POA: Diagnosis not present

## 2024-02-02 DIAGNOSIS — J301 Allergic rhinitis due to pollen: Secondary | ICD-10-CM | POA: Diagnosis not present

## 2024-02-02 DIAGNOSIS — J3081 Allergic rhinitis due to animal (cat) (dog) hair and dander: Secondary | ICD-10-CM | POA: Diagnosis not present

## 2024-02-02 DIAGNOSIS — J3089 Other allergic rhinitis: Secondary | ICD-10-CM | POA: Diagnosis not present

## 2024-02-05 DIAGNOSIS — Z8759 Personal history of other complications of pregnancy, childbirth and the puerperium: Secondary | ICD-10-CM | POA: Diagnosis not present

## 2024-02-05 DIAGNOSIS — O34532 Maternal care for retroversion of gravid uterus, second trimester: Secondary | ICD-10-CM | POA: Diagnosis not present

## 2024-02-05 DIAGNOSIS — Z3A16 16 weeks gestation of pregnancy: Secondary | ICD-10-CM | POA: Diagnosis not present

## 2024-02-05 DIAGNOSIS — O0992 Supervision of high risk pregnancy, unspecified, second trimester: Secondary | ICD-10-CM | POA: Diagnosis not present

## 2024-02-12 DIAGNOSIS — O0990 Supervision of high risk pregnancy, unspecified, unspecified trimester: Secondary | ICD-10-CM | POA: Diagnosis not present

## 2024-02-12 DIAGNOSIS — O0992 Supervision of high risk pregnancy, unspecified, second trimester: Secondary | ICD-10-CM | POA: Diagnosis not present

## 2024-02-12 DIAGNOSIS — O2622 Pregnancy care for patient with recurrent pregnancy loss, second trimester: Secondary | ICD-10-CM | POA: Diagnosis not present

## 2024-02-12 DIAGNOSIS — R7989 Other specified abnormal findings of blood chemistry: Secondary | ICD-10-CM | POA: Diagnosis not present

## 2024-02-12 DIAGNOSIS — N96 Recurrent pregnancy loss: Secondary | ICD-10-CM | POA: Diagnosis not present

## 2024-02-12 DIAGNOSIS — O09529 Supervision of elderly multigravida, unspecified trimester: Secondary | ICD-10-CM | POA: Diagnosis not present

## 2024-02-12 DIAGNOSIS — Z3A17 17 weeks gestation of pregnancy: Secondary | ICD-10-CM | POA: Diagnosis not present

## 2024-02-16 DIAGNOSIS — J3081 Allergic rhinitis due to animal (cat) (dog) hair and dander: Secondary | ICD-10-CM | POA: Diagnosis not present

## 2024-02-16 DIAGNOSIS — J3089 Other allergic rhinitis: Secondary | ICD-10-CM | POA: Diagnosis not present

## 2024-02-16 DIAGNOSIS — J301 Allergic rhinitis due to pollen: Secondary | ICD-10-CM | POA: Diagnosis not present

## 2024-02-20 DIAGNOSIS — R7989 Other specified abnormal findings of blood chemistry: Secondary | ICD-10-CM | POA: Diagnosis not present

## 2024-02-20 DIAGNOSIS — N898 Other specified noninflammatory disorders of vagina: Secondary | ICD-10-CM | POA: Diagnosis not present

## 2024-02-20 DIAGNOSIS — Z3A18 18 weeks gestation of pregnancy: Secondary | ICD-10-CM | POA: Diagnosis not present

## 2024-02-20 DIAGNOSIS — R351 Nocturia: Secondary | ICD-10-CM | POA: Diagnosis not present

## 2024-02-20 DIAGNOSIS — O0992 Supervision of high risk pregnancy, unspecified, second trimester: Secondary | ICD-10-CM | POA: Diagnosis not present

## 2024-02-22 DIAGNOSIS — J301 Allergic rhinitis due to pollen: Secondary | ICD-10-CM | POA: Diagnosis not present

## 2024-02-22 DIAGNOSIS — J3089 Other allergic rhinitis: Secondary | ICD-10-CM | POA: Diagnosis not present

## 2024-02-22 DIAGNOSIS — J3081 Allergic rhinitis due to animal (cat) (dog) hair and dander: Secondary | ICD-10-CM | POA: Diagnosis not present

## 2024-02-27 DIAGNOSIS — O09522 Supervision of elderly multigravida, second trimester: Secondary | ICD-10-CM | POA: Diagnosis not present

## 2024-02-27 DIAGNOSIS — Z7982 Long term (current) use of aspirin: Secondary | ICD-10-CM | POA: Diagnosis not present

## 2024-02-27 DIAGNOSIS — Z3A19 19 weeks gestation of pregnancy: Secondary | ICD-10-CM | POA: Diagnosis not present

## 2024-02-27 DIAGNOSIS — O99212 Obesity complicating pregnancy, second trimester: Secondary | ICD-10-CM | POA: Diagnosis not present

## 2024-02-27 DIAGNOSIS — Z79899 Other long term (current) drug therapy: Secondary | ICD-10-CM | POA: Diagnosis not present

## 2024-02-27 DIAGNOSIS — O26879 Cervical shortening, unspecified trimester: Secondary | ICD-10-CM | POA: Diagnosis not present

## 2024-02-27 DIAGNOSIS — Z3689 Encounter for other specified antenatal screening: Secondary | ICD-10-CM | POA: Diagnosis not present

## 2024-02-27 DIAGNOSIS — J45909 Unspecified asthma, uncomplicated: Secondary | ICD-10-CM | POA: Diagnosis not present

## 2024-02-27 DIAGNOSIS — O26872 Cervical shortening, second trimester: Secondary | ICD-10-CM | POA: Diagnosis not present

## 2024-02-27 DIAGNOSIS — O09892 Supervision of other high risk pregnancies, second trimester: Secondary | ICD-10-CM | POA: Diagnosis not present

## 2024-02-27 DIAGNOSIS — E039 Hypothyroidism, unspecified: Secondary | ICD-10-CM | POA: Diagnosis not present

## 2024-02-27 DIAGNOSIS — O3432 Maternal care for cervical incompetence, second trimester: Secondary | ICD-10-CM | POA: Diagnosis not present

## 2024-02-27 DIAGNOSIS — E669 Obesity, unspecified: Secondary | ICD-10-CM | POA: Diagnosis not present

## 2024-02-27 DIAGNOSIS — O99282 Endocrine, nutritional and metabolic diseases complicating pregnancy, second trimester: Secondary | ICD-10-CM | POA: Diagnosis not present

## 2024-02-28 DIAGNOSIS — Z7982 Long term (current) use of aspirin: Secondary | ICD-10-CM | POA: Diagnosis not present

## 2024-02-28 DIAGNOSIS — E669 Obesity, unspecified: Secondary | ICD-10-CM | POA: Diagnosis not present

## 2024-02-28 DIAGNOSIS — J45909 Unspecified asthma, uncomplicated: Secondary | ICD-10-CM | POA: Diagnosis not present

## 2024-02-28 DIAGNOSIS — O99212 Obesity complicating pregnancy, second trimester: Secondary | ICD-10-CM | POA: Diagnosis not present

## 2024-02-28 DIAGNOSIS — O26879 Cervical shortening, unspecified trimester: Secondary | ICD-10-CM | POA: Diagnosis not present

## 2024-02-28 DIAGNOSIS — Z3A19 19 weeks gestation of pregnancy: Secondary | ICD-10-CM | POA: Diagnosis not present

## 2024-02-28 DIAGNOSIS — O3432 Maternal care for cervical incompetence, second trimester: Secondary | ICD-10-CM | POA: Diagnosis not present

## 2024-02-28 DIAGNOSIS — E039 Hypothyroidism, unspecified: Secondary | ICD-10-CM | POA: Diagnosis not present

## 2024-02-28 DIAGNOSIS — O99282 Endocrine, nutritional and metabolic diseases complicating pregnancy, second trimester: Secondary | ICD-10-CM | POA: Diagnosis not present

## 2024-02-28 DIAGNOSIS — O26872 Cervical shortening, second trimester: Secondary | ICD-10-CM | POA: Diagnosis not present

## 2024-02-28 DIAGNOSIS — Z79899 Other long term (current) drug therapy: Secondary | ICD-10-CM | POA: Diagnosis not present

## 2024-03-05 DIAGNOSIS — O3432 Maternal care for cervical incompetence, second trimester: Secondary | ICD-10-CM | POA: Diagnosis not present

## 2024-03-05 DIAGNOSIS — Z3A20 20 weeks gestation of pregnancy: Secondary | ICD-10-CM | POA: Diagnosis not present

## 2024-03-06 DIAGNOSIS — J3089 Other allergic rhinitis: Secondary | ICD-10-CM | POA: Diagnosis not present

## 2024-03-06 DIAGNOSIS — J301 Allergic rhinitis due to pollen: Secondary | ICD-10-CM | POA: Diagnosis not present

## 2024-03-06 DIAGNOSIS — J3081 Allergic rhinitis due to animal (cat) (dog) hair and dander: Secondary | ICD-10-CM | POA: Diagnosis not present

## 2024-03-12 DIAGNOSIS — J301 Allergic rhinitis due to pollen: Secondary | ICD-10-CM | POA: Diagnosis not present

## 2024-03-12 DIAGNOSIS — J3089 Other allergic rhinitis: Secondary | ICD-10-CM | POA: Diagnosis not present

## 2024-03-12 DIAGNOSIS — J3081 Allergic rhinitis due to animal (cat) (dog) hair and dander: Secondary | ICD-10-CM | POA: Diagnosis not present

## 2024-03-13 DIAGNOSIS — O0990 Supervision of high risk pregnancy, unspecified, unspecified trimester: Secondary | ICD-10-CM | POA: Diagnosis not present

## 2024-03-21 DIAGNOSIS — J3089 Other allergic rhinitis: Secondary | ICD-10-CM | POA: Diagnosis not present

## 2024-03-21 DIAGNOSIS — J301 Allergic rhinitis due to pollen: Secondary | ICD-10-CM | POA: Diagnosis not present

## 2024-03-21 DIAGNOSIS — J3081 Allergic rhinitis due to animal (cat) (dog) hair and dander: Secondary | ICD-10-CM | POA: Diagnosis not present

## 2024-03-27 DIAGNOSIS — J3081 Allergic rhinitis due to animal (cat) (dog) hair and dander: Secondary | ICD-10-CM | POA: Diagnosis not present

## 2024-03-27 DIAGNOSIS — J301 Allergic rhinitis due to pollen: Secondary | ICD-10-CM | POA: Diagnosis not present

## 2024-03-27 DIAGNOSIS — J3089 Other allergic rhinitis: Secondary | ICD-10-CM | POA: Diagnosis not present

## 2024-04-02 DIAGNOSIS — Z9889 Other specified postprocedural states: Secondary | ICD-10-CM | POA: Diagnosis not present

## 2024-04-02 DIAGNOSIS — O3432 Maternal care for cervical incompetence, second trimester: Secondary | ICD-10-CM | POA: Diagnosis not present

## 2024-04-02 DIAGNOSIS — O34512 Maternal care for incarceration of gravid uterus, second trimester: Secondary | ICD-10-CM | POA: Diagnosis not present

## 2024-04-02 DIAGNOSIS — E669 Obesity, unspecified: Secondary | ICD-10-CM | POA: Diagnosis not present

## 2024-04-05 DIAGNOSIS — J3081 Allergic rhinitis due to animal (cat) (dog) hair and dander: Secondary | ICD-10-CM | POA: Diagnosis not present

## 2024-04-05 DIAGNOSIS — J301 Allergic rhinitis due to pollen: Secondary | ICD-10-CM | POA: Diagnosis not present

## 2024-04-05 DIAGNOSIS — J3089 Other allergic rhinitis: Secondary | ICD-10-CM | POA: Diagnosis not present

## 2024-04-11 DIAGNOSIS — R7989 Other specified abnormal findings of blood chemistry: Secondary | ICD-10-CM | POA: Diagnosis not present

## 2024-04-11 DIAGNOSIS — Z131 Encounter for screening for diabetes mellitus: Secondary | ICD-10-CM | POA: Diagnosis not present

## 2024-04-11 DIAGNOSIS — Z3A25 25 weeks gestation of pregnancy: Secondary | ICD-10-CM | POA: Diagnosis not present

## 2024-04-11 DIAGNOSIS — R350 Frequency of micturition: Secondary | ICD-10-CM | POA: Diagnosis not present

## 2024-04-11 DIAGNOSIS — Z113 Encounter for screening for infections with a predominantly sexual mode of transmission: Secondary | ICD-10-CM | POA: Diagnosis not present

## 2024-04-11 DIAGNOSIS — O0990 Supervision of high risk pregnancy, unspecified, unspecified trimester: Secondary | ICD-10-CM | POA: Diagnosis not present

## 2024-04-12 DIAGNOSIS — J3081 Allergic rhinitis due to animal (cat) (dog) hair and dander: Secondary | ICD-10-CM | POA: Diagnosis not present

## 2024-04-12 DIAGNOSIS — J301 Allergic rhinitis due to pollen: Secondary | ICD-10-CM | POA: Diagnosis not present

## 2024-04-12 DIAGNOSIS — J3089 Other allergic rhinitis: Secondary | ICD-10-CM | POA: Diagnosis not present

## 2024-04-15 DIAGNOSIS — O0992 Supervision of high risk pregnancy, unspecified, second trimester: Secondary | ICD-10-CM | POA: Diagnosis not present

## 2024-04-15 DIAGNOSIS — R7989 Other specified abnormal findings of blood chemistry: Secondary | ICD-10-CM | POA: Diagnosis not present

## 2024-04-19 DIAGNOSIS — J301 Allergic rhinitis due to pollen: Secondary | ICD-10-CM | POA: Diagnosis not present

## 2024-04-19 DIAGNOSIS — J3089 Other allergic rhinitis: Secondary | ICD-10-CM | POA: Diagnosis not present

## 2024-04-19 DIAGNOSIS — J3081 Allergic rhinitis due to animal (cat) (dog) hair and dander: Secondary | ICD-10-CM | POA: Diagnosis not present

## 2024-04-25 DIAGNOSIS — J3081 Allergic rhinitis due to animal (cat) (dog) hair and dander: Secondary | ICD-10-CM | POA: Diagnosis not present

## 2024-04-25 DIAGNOSIS — J301 Allergic rhinitis due to pollen: Secondary | ICD-10-CM | POA: Diagnosis not present

## 2024-04-25 DIAGNOSIS — J3089 Other allergic rhinitis: Secondary | ICD-10-CM | POA: Diagnosis not present

## 2024-04-30 DIAGNOSIS — E669 Obesity, unspecified: Secondary | ICD-10-CM | POA: Diagnosis not present

## 2024-04-30 DIAGNOSIS — O365930 Maternal care for other known or suspected poor fetal growth, third trimester, not applicable or unspecified: Secondary | ICD-10-CM | POA: Diagnosis not present

## 2024-04-30 DIAGNOSIS — Z9889 Other specified postprocedural states: Secondary | ICD-10-CM | POA: Diagnosis not present

## 2024-04-30 DIAGNOSIS — O3432 Maternal care for cervical incompetence, second trimester: Secondary | ICD-10-CM | POA: Diagnosis not present

## 2024-05-02 DIAGNOSIS — J301 Allergic rhinitis due to pollen: Secondary | ICD-10-CM | POA: Diagnosis not present

## 2024-05-02 DIAGNOSIS — J3089 Other allergic rhinitis: Secondary | ICD-10-CM | POA: Diagnosis not present

## 2024-05-02 DIAGNOSIS — J3081 Allergic rhinitis due to animal (cat) (dog) hair and dander: Secondary | ICD-10-CM | POA: Diagnosis not present

## 2024-05-08 DIAGNOSIS — M5489 Other dorsalgia: Secondary | ICD-10-CM | POA: Diagnosis not present

## 2024-05-09 DIAGNOSIS — Z3A29 29 weeks gestation of pregnancy: Secondary | ICD-10-CM | POA: Diagnosis not present

## 2024-05-09 DIAGNOSIS — O0990 Supervision of high risk pregnancy, unspecified, unspecified trimester: Secondary | ICD-10-CM | POA: Diagnosis not present

## 2024-05-15 DIAGNOSIS — Z1589 Genetic susceptibility to other disease: Secondary | ICD-10-CM | POA: Diagnosis not present

## 2024-05-15 DIAGNOSIS — O09529 Supervision of elderly multigravida, unspecified trimester: Secondary | ICD-10-CM | POA: Diagnosis not present

## 2024-05-15 DIAGNOSIS — O365930 Maternal care for other known or suspected poor fetal growth, third trimester, not applicable or unspecified: Secondary | ICD-10-CM | POA: Diagnosis not present

## 2024-05-15 DIAGNOSIS — O26879 Cervical shortening, unspecified trimester: Secondary | ICD-10-CM | POA: Diagnosis not present

## 2024-05-15 DIAGNOSIS — O0990 Supervision of high risk pregnancy, unspecified, unspecified trimester: Secondary | ICD-10-CM | POA: Diagnosis not present

## 2024-05-17 DIAGNOSIS — J3081 Allergic rhinitis due to animal (cat) (dog) hair and dander: Secondary | ICD-10-CM | POA: Diagnosis not present

## 2024-05-17 DIAGNOSIS — J3089 Other allergic rhinitis: Secondary | ICD-10-CM | POA: Diagnosis not present

## 2024-05-17 DIAGNOSIS — J301 Allergic rhinitis due to pollen: Secondary | ICD-10-CM | POA: Diagnosis not present

## 2024-05-23 DIAGNOSIS — J301 Allergic rhinitis due to pollen: Secondary | ICD-10-CM | POA: Diagnosis not present

## 2024-05-23 DIAGNOSIS — J3081 Allergic rhinitis due to animal (cat) (dog) hair and dander: Secondary | ICD-10-CM | POA: Diagnosis not present

## 2024-05-23 DIAGNOSIS — J3089 Other allergic rhinitis: Secondary | ICD-10-CM | POA: Diagnosis not present

## 2024-05-28 DIAGNOSIS — J3081 Allergic rhinitis due to animal (cat) (dog) hair and dander: Secondary | ICD-10-CM | POA: Diagnosis not present

## 2024-05-28 DIAGNOSIS — J3089 Other allergic rhinitis: Secondary | ICD-10-CM | POA: Diagnosis not present

## 2024-05-28 DIAGNOSIS — J301 Allergic rhinitis due to pollen: Secondary | ICD-10-CM | POA: Diagnosis not present

## 2024-05-29 DIAGNOSIS — O09292 Supervision of pregnancy with other poor reproductive or obstetric history, second trimester: Secondary | ICD-10-CM | POA: Diagnosis not present

## 2024-05-29 DIAGNOSIS — O0990 Supervision of high risk pregnancy, unspecified, unspecified trimester: Secondary | ICD-10-CM | POA: Diagnosis not present

## 2024-05-29 DIAGNOSIS — R350 Frequency of micturition: Secondary | ICD-10-CM | POA: Diagnosis not present

## 2024-05-29 DIAGNOSIS — Z8759 Personal history of other complications of pregnancy, childbirth and the puerperium: Secondary | ICD-10-CM | POA: Diagnosis not present

## 2024-05-29 DIAGNOSIS — Z3A32 32 weeks gestation of pregnancy: Secondary | ICD-10-CM | POA: Diagnosis not present

## 2024-05-29 DIAGNOSIS — Z23 Encounter for immunization: Secondary | ICD-10-CM | POA: Diagnosis not present

## 2024-05-29 DIAGNOSIS — Z1589 Genetic susceptibility to other disease: Secondary | ICD-10-CM | POA: Diagnosis not present

## 2024-05-29 DIAGNOSIS — O09529 Supervision of elderly multigravida, unspecified trimester: Secondary | ICD-10-CM | POA: Diagnosis not present

## 2024-06-04 DIAGNOSIS — J3089 Other allergic rhinitis: Secondary | ICD-10-CM | POA: Diagnosis not present

## 2024-06-04 DIAGNOSIS — J3081 Allergic rhinitis due to animal (cat) (dog) hair and dander: Secondary | ICD-10-CM | POA: Diagnosis not present

## 2024-06-04 DIAGNOSIS — J301 Allergic rhinitis due to pollen: Secondary | ICD-10-CM | POA: Diagnosis not present

## 2024-06-05 DIAGNOSIS — O09292 Supervision of pregnancy with other poor reproductive or obstetric history, second trimester: Secondary | ICD-10-CM | POA: Diagnosis not present

## 2024-06-05 DIAGNOSIS — O09529 Supervision of elderly multigravida, unspecified trimester: Secondary | ICD-10-CM | POA: Diagnosis not present

## 2024-06-05 DIAGNOSIS — Z1589 Genetic susceptibility to other disease: Secondary | ICD-10-CM | POA: Diagnosis not present

## 2024-06-05 DIAGNOSIS — Z8759 Personal history of other complications of pregnancy, childbirth and the puerperium: Secondary | ICD-10-CM | POA: Diagnosis not present

## 2024-06-10 DIAGNOSIS — Z3A34 34 weeks gestation of pregnancy: Secondary | ICD-10-CM | POA: Diagnosis not present

## 2024-06-10 DIAGNOSIS — O0990 Supervision of high risk pregnancy, unspecified, unspecified trimester: Secondary | ICD-10-CM | POA: Diagnosis not present

## 2024-06-12 DIAGNOSIS — J4530 Mild persistent asthma, uncomplicated: Secondary | ICD-10-CM | POA: Diagnosis not present

## 2024-06-12 DIAGNOSIS — Z3A34 34 weeks gestation of pregnancy: Secondary | ICD-10-CM | POA: Diagnosis not present

## 2024-06-12 DIAGNOSIS — J301 Allergic rhinitis due to pollen: Secondary | ICD-10-CM | POA: Diagnosis not present

## 2024-06-12 DIAGNOSIS — J3089 Other allergic rhinitis: Secondary | ICD-10-CM | POA: Diagnosis not present

## 2024-06-12 DIAGNOSIS — J3081 Allergic rhinitis due to animal (cat) (dog) hair and dander: Secondary | ICD-10-CM | POA: Diagnosis not present

## 2024-06-12 DIAGNOSIS — O09529 Supervision of elderly multigravida, unspecified trimester: Secondary | ICD-10-CM | POA: Diagnosis not present

## 2024-06-12 DIAGNOSIS — O99210 Obesity complicating pregnancy, unspecified trimester: Secondary | ICD-10-CM | POA: Diagnosis not present

## 2024-06-19 DIAGNOSIS — Z3A35 35 weeks gestation of pregnancy: Secondary | ICD-10-CM | POA: Diagnosis not present

## 2024-06-19 DIAGNOSIS — O3433 Maternal care for cervical incompetence, third trimester: Secondary | ICD-10-CM | POA: Diagnosis not present

## 2024-06-19 DIAGNOSIS — O0990 Supervision of high risk pregnancy, unspecified, unspecified trimester: Secondary | ICD-10-CM | POA: Diagnosis not present

## 2024-06-19 DIAGNOSIS — J3081 Allergic rhinitis due to animal (cat) (dog) hair and dander: Secondary | ICD-10-CM | POA: Diagnosis not present

## 2024-06-19 DIAGNOSIS — J3089 Other allergic rhinitis: Secondary | ICD-10-CM | POA: Diagnosis not present

## 2024-06-19 DIAGNOSIS — J301 Allergic rhinitis due to pollen: Secondary | ICD-10-CM | POA: Diagnosis not present

## 2024-06-19 DIAGNOSIS — O09529 Supervision of elderly multigravida, unspecified trimester: Secondary | ICD-10-CM | POA: Diagnosis not present

## 2024-06-20 DIAGNOSIS — E010 Iodine-deficiency related diffuse (endemic) goiter: Secondary | ICD-10-CM | POA: Diagnosis not present

## 2024-06-20 DIAGNOSIS — O42913 Preterm premature rupture of membranes, unspecified as to length of time between rupture and onset of labor, third trimester: Secondary | ICD-10-CM | POA: Diagnosis not present

## 2024-06-20 DIAGNOSIS — Z7982 Long term (current) use of aspirin: Secondary | ICD-10-CM | POA: Diagnosis not present

## 2024-06-20 DIAGNOSIS — Z3A35 35 weeks gestation of pregnancy: Secondary | ICD-10-CM | POA: Diagnosis not present

## 2024-06-20 DIAGNOSIS — O99214 Obesity complicating childbirth: Secondary | ICD-10-CM | POA: Diagnosis not present

## 2024-06-20 DIAGNOSIS — O9952 Diseases of the respiratory system complicating childbirth: Secondary | ICD-10-CM | POA: Diagnosis not present

## 2024-06-20 DIAGNOSIS — E7212 Methylenetetrahydrofolate reductase deficiency: Secondary | ICD-10-CM | POA: Diagnosis not present

## 2024-06-20 DIAGNOSIS — R252 Cramp and spasm: Secondary | ICD-10-CM | POA: Diagnosis not present

## 2024-06-20 DIAGNOSIS — Z79899 Other long term (current) drug therapy: Secondary | ICD-10-CM | POA: Diagnosis not present

## 2024-06-20 DIAGNOSIS — J4540 Moderate persistent asthma, uncomplicated: Secondary | ICD-10-CM | POA: Diagnosis not present

## 2024-06-20 DIAGNOSIS — Z3483 Encounter for supervision of other normal pregnancy, third trimester: Secondary | ICD-10-CM | POA: Diagnosis not present

## 2024-06-20 DIAGNOSIS — O3433 Maternal care for cervical incompetence, third trimester: Secondary | ICD-10-CM | POA: Diagnosis not present

## 2024-06-20 DIAGNOSIS — O42113 Preterm premature rupture of membranes, onset of labor more than 24 hours following rupture, third trimester: Secondary | ICD-10-CM | POA: Diagnosis not present

## 2024-06-20 DIAGNOSIS — O9089 Other complications of the puerperium, not elsewhere classified: Secondary | ICD-10-CM | POA: Diagnosis not present

## 2024-06-20 DIAGNOSIS — O99284 Endocrine, nutritional and metabolic diseases complicating childbirth: Secondary | ICD-10-CM | POA: Diagnosis not present

## 2024-06-21 DIAGNOSIS — O3433 Maternal care for cervical incompetence, third trimester: Secondary | ICD-10-CM | POA: Diagnosis not present

## 2024-06-21 DIAGNOSIS — O42913 Preterm premature rupture of membranes, unspecified as to length of time between rupture and onset of labor, third trimester: Secondary | ICD-10-CM | POA: Diagnosis not present

## 2024-06-21 DIAGNOSIS — Z3A35 35 weeks gestation of pregnancy: Secondary | ICD-10-CM | POA: Diagnosis not present

## 2024-07-10 DIAGNOSIS — F53 Postpartum depression: Secondary | ICD-10-CM | POA: Diagnosis not present

## 2024-07-12 DIAGNOSIS — F53 Postpartum depression: Secondary | ICD-10-CM | POA: Diagnosis not present

## 2024-07-15 DIAGNOSIS — F53 Postpartum depression: Secondary | ICD-10-CM | POA: Diagnosis not present

## 2024-07-17 DIAGNOSIS — F53 Postpartum depression: Secondary | ICD-10-CM | POA: Diagnosis not present

## 2024-07-19 DIAGNOSIS — F53 Postpartum depression: Secondary | ICD-10-CM | POA: Diagnosis not present

## 2024-08-02 DIAGNOSIS — Z8759 Personal history of other complications of pregnancy, childbirth and the puerperium: Secondary | ICD-10-CM | POA: Diagnosis not present

## 2024-08-02 DIAGNOSIS — Z9889 Other specified postprocedural states: Secondary | ICD-10-CM | POA: Diagnosis not present

## 2024-08-02 DIAGNOSIS — Z1330 Encounter for screening examination for mental health and behavioral disorders, unspecified: Secondary | ICD-10-CM | POA: Diagnosis not present

## 2024-08-02 DIAGNOSIS — Z8751 Personal history of pre-term labor: Secondary | ICD-10-CM | POA: Diagnosis not present
